# Patient Record
Sex: Female | Born: 1965 | Race: White | Hispanic: No | Marital: Married | Smoking: Never smoker
Health system: Southern US, Community
[De-identification: ages and names within clinical notes are randomized; demographics above are authoritative.]

## PROBLEM LIST (undated history)

## (undated) DIAGNOSIS — E785 Hyperlipidemia, unspecified: Secondary | ICD-10-CM

## (undated) DIAGNOSIS — K635 Polyp of colon: Secondary | ICD-10-CM

## (undated) DIAGNOSIS — T7840XA Allergy, unspecified, initial encounter: Secondary | ICD-10-CM

## (undated) DIAGNOSIS — B009 Herpesviral infection, unspecified: Secondary | ICD-10-CM

## (undated) HISTORY — DX: Polyp of colon: K63.5

## (undated) HISTORY — DX: Hyperlipidemia, unspecified: E78.5

## (undated) HISTORY — PX: CERVICAL FUSION: SHX112

## (undated) HISTORY — DX: Herpesviral infection, unspecified: B00.9

## (undated) HISTORY — DX: Allergy, unspecified, initial encounter: T78.40XA

---

## 2000-09-04 ENCOUNTER — Other Ambulatory Visit: Admission: RE | Admit: 2000-09-04 | Discharge: 2000-09-04 | Payer: Self-pay | Admitting: Family Medicine

## 2002-04-21 HISTORY — PX: CERVICAL FUSION: SHX112

## 2002-10-17 ENCOUNTER — Ambulatory Visit (HOSPITAL_COMMUNITY): Admission: RE | Admit: 2002-10-17 | Discharge: 2002-10-18 | Payer: Self-pay | Admitting: Neurosurgery

## 2003-02-16 ENCOUNTER — Ambulatory Visit (HOSPITAL_COMMUNITY): Admission: RE | Admit: 2003-02-16 | Discharge: 2003-02-16 | Payer: Self-pay | Admitting: Obstetrics and Gynecology

## 2003-03-10 ENCOUNTER — Ambulatory Visit (HOSPITAL_COMMUNITY): Admission: RE | Admit: 2003-03-10 | Discharge: 2003-03-10 | Payer: Self-pay | Admitting: Obstetrics and Gynecology

## 2003-06-22 ENCOUNTER — Other Ambulatory Visit: Admission: RE | Admit: 2003-06-22 | Discharge: 2003-06-22 | Payer: Self-pay | Admitting: Obstetrics and Gynecology

## 2003-08-02 ENCOUNTER — Inpatient Hospital Stay (HOSPITAL_COMMUNITY): Admission: AD | Admit: 2003-08-02 | Discharge: 2003-08-05 | Payer: Self-pay | Admitting: Obstetrics and Gynecology

## 2004-07-09 ENCOUNTER — Other Ambulatory Visit: Admission: RE | Admit: 2004-07-09 | Discharge: 2004-07-09 | Payer: Self-pay | Admitting: Obstetrics and Gynecology

## 2004-11-11 ENCOUNTER — Ambulatory Visit (HOSPITAL_COMMUNITY): Admission: RE | Admit: 2004-11-11 | Discharge: 2004-11-11 | Payer: Self-pay | Admitting: Obstetrics and Gynecology

## 2005-06-24 ENCOUNTER — Ambulatory Visit (HOSPITAL_COMMUNITY): Admission: RE | Admit: 2005-06-24 | Discharge: 2005-06-24 | Payer: Self-pay | Admitting: Obstetrics and Gynecology

## 2005-12-16 ENCOUNTER — Other Ambulatory Visit: Admission: RE | Admit: 2005-12-16 | Discharge: 2005-12-16 | Payer: Self-pay | Admitting: Obstetrics and Gynecology

## 2005-12-17 ENCOUNTER — Ambulatory Visit (HOSPITAL_COMMUNITY): Admission: RE | Admit: 2005-12-17 | Discharge: 2005-12-17 | Payer: Self-pay | Admitting: Obstetrics and Gynecology

## 2005-12-17 ENCOUNTER — Encounter (INDEPENDENT_AMBULATORY_CARE_PROVIDER_SITE_OTHER): Payer: Self-pay | Admitting: Specialist

## 2007-05-13 ENCOUNTER — Ambulatory Visit (HOSPITAL_COMMUNITY): Admission: RE | Admit: 2007-05-13 | Discharge: 2007-05-13 | Payer: Self-pay | Admitting: Obstetrics and Gynecology

## 2008-08-03 ENCOUNTER — Inpatient Hospital Stay (HOSPITAL_COMMUNITY): Admission: AD | Admit: 2008-08-03 | Discharge: 2008-08-05 | Payer: Self-pay | Admitting: Obstetrics and Gynecology

## 2009-10-18 ENCOUNTER — Ambulatory Visit (HOSPITAL_COMMUNITY): Admission: RE | Admit: 2009-10-18 | Discharge: 2009-10-18 | Payer: Self-pay | Admitting: Obstetrics and Gynecology

## 2010-07-31 LAB — CBC
HCT: 28.3 % — ABNORMAL LOW (ref 36.0–46.0)
HCT: 37.7 % (ref 36.0–46.0)
Hemoglobin: 9.8 g/dL — ABNORMAL LOW (ref 12.0–15.0)
Hemoglobin: 13 g/dL (ref 12.0–15.0)
MCHC: 34.5 g/dL (ref 30.0–36.0)
MCHC: 34.5 g/dL (ref 30.0–36.0)
MCV: 90.6 fL (ref 78.0–100.0)
MCV: 89.6 fL (ref 78.0–100.0)
Platelets: 140 10*3/uL — ABNORMAL LOW (ref 150–400)
Platelets: 170 10*3/uL (ref 150–400)
RBC: 3.12 MIL/uL — ABNORMAL LOW (ref 3.87–5.11)
RBC: 4.21 MIL/uL (ref 3.87–5.11)
RDW: 14.6 % (ref 11.5–15.5)
RDW: 14.7 % (ref 11.5–15.5)
WBC: 10.5 10*3/uL (ref 4.0–10.5)
WBC: 10.6 10*3/uL — ABNORMAL HIGH (ref 4.0–10.5)

## 2010-07-31 LAB — RPR: RPR Ser Ql: NONREACTIVE

## 2010-09-03 NOTE — Discharge Summary (Signed)
Whitney Logan, Whitney Logan                 ACCOUNT NO.:  [id number]   MEDICAL RECORD NO.:  [id number]          PATIENT TYPE:  INP   LOCATION:  9140                          FACILITY:  [facility]   PHYSICIAN:  Crist Fat. Rivard, M.D. DATE OF BIRTH:  08/07/1965   DATE OF ADMISSION:  08/03/2008  DATE OF DISCHARGE:  08/05/2008                               DISCHARGE SUMMARY   ADMISSION DIAGNOSES:  1. Intrauterine pregnancy at 38-4/7th weeks.  2. Spontaneous rupture of membranes.  3. History of previous cesarean section.   DISCHARGE DIAGNOSES:  1. Intrauterine pregnancy at 38-4/7th weeks, delivered.  2. Repeat low transverse cesarean section.   PROCEDURE:  Repeat low transverse cesarean section.   HOSPITAL COURSE:  [name] is a 45 year old gravida 4, para 1-0-2-1  admitted at 38-4/7th weeks with complaints of spontaneous rupture of  membranes early a.m. the day of admission as well as regular  contractions every 3-5 minutes since rupture of membranes.  The  patient's pregnancy remarkable for:  1. Advanced maternal age.  2. Previous C-section for failure to progress.  3. History of cervical vertebral fusion.  4. History of abnormal Pap.  5. IVF with donor eggs.  6. Spells with HSV.   Upon admission to [facility], the patient was noted to have grossly ruptured  membranes and in spontaneous labor.  Cervix was unable to be reached due  to posterior position.  The patient at that time expressed desire for  repeat low transverse cesarean section.  The risks, benefits, and  alternatives of cesarean section were discussed with the patient and she  desired to repeat.   The patient underwent a repeat low transverse cesarean section by Dr.  Pennie Rushing and delivered a viable female infant, Callie, weight 8 pounds 12  ounces with Apgars of 9 and 9 at one and five minutes respectively.  Surgery was uncomplicated.  Estimated blood loss 750 mL.  On admission,  the patient's hemoglobin 13.0.   On postoperative day  #1, the patient's hemoglobin 9.8.  However, the  patient was asymptomatic, without syncope or weakness.  The patient was  ambulating, voiding, and tolerating liquids and solids without  difficulty.  The patient's pain was well managed and breastfeeding was  going well.  The patient with normal lochia.   On postoperative day #2, the patient continued to do well and requested  discharge home.  The patient was passing flatus.  No BM.  The patient's  pain well controlled and breastfeeding without difficulty.  The patient  remained asymptomatic in regard to anemia.  The patient's incision noted  to have redness in the areas of previous adhesive contact, but no  evidence of cellulitis.  The patient remained with small lochia.  It was  felt that the patient had received benefit of her hospital stay and was  discharged to home.   DISCHARGE INSTRUCTIONS:  Per [facility] handout.   DISCHARGE MEDICATIONS:  1. Motrin 600 mg p.o. q.6 h. p.r.n. pain.  2. Tylox 1-2 tabs p.o. every 4-6 h. as needed for pain.  3. Maxaron Forte 1 cap p.o.  daily.   Discharge followup will occur at 6 weeks at [facility] and  the patient does not desire contraception due to the previous history of  infertility.      Rhona Leavens, CNM      Crist Fat Rivard, M.D.  Electronically Signed    NOS/MEDQ  D:  08/05/2008  T:  08/06/2008  Job:  [id number]

## 2010-09-03 NOTE — Op Note (Signed)
Whitney Logan, Whitney Logan                 ACCOUNT NO.:  [id number]   MEDICAL RECORD NO.:  [id number]          PATIENT TYPE:  INP   LOCATION:  9140                          [facility]   PHYSICIAN:  Hal Morales, M.D.DATE OF BIRTH:  [birth year]   DATE OF PROCEDURE:  08/03/2008  DATE OF DISCHARGE:                               OPERATIVE REPORT   PREOPERATIVE DIAGNOSES:  1. Intrauterine pregnancy at term.  2. Prior cesarean section.  3. Desire for repeat cesarean section.  4. History of infertility.  5. Premature rupture of membranes.   POSTOPERATIVE DIAGNOSES:  1. Intrauterine pregnancy at term.  2. Prior cesarean section.  3. Desire for repeat cesarean section.  4. History of infertility.  5. Premature rupture of membranes.   OPERATION:  Repeat low transverse cesarean section.   SURGEON:  Vanessa P. Pennie Rushing, MD   FIRST ASSISTANT:  Alonna Minium, certified nurse midwife.   ANESTHESIA:  Spinal.   ESTIMATED BLOOD LOSS:  750 mL.   COMPLICATIONS:  None.   FINDINGS:  The patient was delivered of a female infant whose name is  Tresa Endo, weighing 8 pounds 12 ounces with Apgars of 9 and 9 at 1 and 5  minutes respectively.  The uterus, tubes, and ovaries were normal for  the gravid state.  The placenta contained an eccentrically inserted 3-  vessel cord.   PROCEDURE:  The patient was taken to the operating room.  After  appropriate identification, was placed on the operating table.  After  the placement of adequate spinal anesthesia, she was placed in the  supine position with the left lateral tilt.  The abdomen and perineum  were prepped with multiple layers of Betadine and a Foley catheter  inserted into the bladder and connected to straight drainage.  The  abdomen was draped as a sterile field.  The suprapubic region was  infiltrated with a 10 mL solution of quarter percent Marcaine.  A  suprapubic incision was made at the site of the previous cesarean  section incision.   The abdomen was opened in layers.  The peritoneum was  entered and the bladder blade placed.  The uterus was incised  approximately 2 cm above the uterovesical fold and that incision taken  laterally on either side bluntly.  The membranes were ruptured and the  infant delivered from the occiput transverse position.  After the nares  and pharynx were suctioned and the cord clamped and cut, the infant was  handed off to the awaiting pediatricians.  The appropriate cord blood  was drawn and the placenta allowed to separate from the uterus and  removed from the operative field.  The uterine cavity was cleared of  membranes with laparotomy pads.  The uterine incision was closed with a  running interlocking suture of 0 Vicryl.  An imbricating suture of 0  Vicryl was then placed with adequate hemostasis.  Copious irrigation was  carried out.  The abdominal peritoneum was closed with running suture of  2-0 Vicryl.  The rectus muscles were reapproximated in the midline with  a figure-of-eight suture of 2-0  Vicryl.  The rectus muscles were  irrigated and made hemostatic with Bovie cautery.  The rectus fascia was  closed with a running suture of 0 Vicryl, then reinforced on either side  of midline with figure-of-eight sutures of 0 Vicryl.  The subcutaneous  tissue was irrigated and made hemostatic with Bovie cautery.  A  subcuticular suture of 3-0 Monocryl was used to close the skin incision.  A sterile dressing was applied and the patient taken from the operating  room to the recovery room in satisfactory condition having tolerated the  procedure well with sponge and instrument counts correct.  The infant  went to the full-term nursery.      Hal Morales, M.D.  Electronically Signed     VPH/MEDQ  D:  08/03/2008  T:  08/03/2008  Job:  [id number]

## 2010-09-03 NOTE — H&P (Signed)
NAMEJAKAYA, JACOBOWITZ NO.:  [id number]   MEDICAL RECORD NO.:  [id number]          PATIENT TYPE:  INP   LOCATION:  9140                          [facility]   PHYSICIAN:  Hal Morales, M.D.DATE OF BIRTH:  [birth year]   DATE OF ADMISSION:  08/03/2008  DATE OF DISCHARGE:                              HISTORY & PHYSICAL   HISTORY OF PRESENT ILLNESS:  Ms. Pasion is a 45 year old married white  female gravida 4, para 1-0-2-1 at 38-4/7 weeks per an [facility] of August 13, 2008, who presents with chief complaint of some leakage of fluid since  around 1:30 a.m. which awoke her from her sleep and since has had  regular contractions about every 3-5 minutes.  She reports good fetal  movement.  She had some pink tinged mucous as well.  She denies PIH or  UTI signs or symptoms, fever, cough, shortness of breath or GI  complaints.  She is followed by the MD service at [facility].  History  remarkable for:  1. Previous C-section for failure to progress and desires repeat.  2. Advanced maternal age.  3. History of C5-C6 cervical fusion.  4. History of abnormal Pap.  5. This pregnancy is a result of IVF with donor egg, six bouts with      history of HSV.   ALLERGIES:  The patient denies any latex or medication allergies.  She  does hay fever.   OBSTETRICAL HISTORY:  Rhett Bannister 1 was a primary low transverse cesarean  section in April 2005, a female named Thurmond Butts, weighed 713.  The patient did  reach 10 cm and pushed for three hours and still did not progress to  getting the baby delivered that way, so C-section was performed.  Gravida 2, spontaneous abortion, [redacted] weeks gestation January 2007.  Gravida 3 missed AB at 11 weeks in August 2007.  She did have a  subsequent D&C.  Current pregnancy is gravida 4.   PAST MEDICAL HISTORY:  1. Oral contraceptive pills in the past for birth control.  2. History of abnormal Pap 2002, repeats within normal limits.  3. This is IVF pregnancy with donor egg.   Reports retroverted uterus.  4. Husband is positive HSV.  The patient negative per report.  5. Patient positive HPV.  6. Reports varicella as a child.  7. History of anemia in the past.   SURGICAL HISTORY:  1. IVS is current pregnancy.  2. Wisdom teeth extraction.  3. C5-C6 cervical fusion in 2004.  4. Genetic history remarkable for the patient is age 24.  She does      have a cousin who has a Down syndrome baby.   FAMILY HISTORY:  Paternal grandmother heart disease.  The patient  questionable mitral valve prolapse.  Mom and dad chronic hypertension.  Paternal grandmother varicosities.  Paternal grandmother breast cancer.  Maternal grandfather COPD.  Maternal grandmother and paternal  grandmother as well as mom's father diabetic.   SOCIAL HISTORY:  She is a married white female.  Her husband's name is  Lawerance Bach.  The patient had her  Ph.D.  She is a full-time Chiropodist.  Father of the baby is a Education officer, community.  He is involved and  supportive.  Denies alcohol, tobacco or illicit drug use.   HISTORY OF PRESENT PREGNANCY:  She entered care October 2 with a  pregravid weight of 170, that day was 172-1/2, __________8.  Her BMI was  25.8.  The patient did have some flu-like symptoms and did receive  Tamiflu times 5 days October 1.  Normal Pap January 13.  She had first  trimester and a graded screen at [facility] and was within normal  limits.  She received H1N1 vaccine February 17, 2008.  She had her  anatomy ultrasound around 18-5/7 weeks showed normal growth and  development.  They are expecting a girl.  Cervical length was 4.4 cm  normal fluid, anterior placenta with a three-vessel cord.  The patient  did have HSV II glycoproteins drawn serum which was negative.  Treated  with Z-Pak around 21-4/7 weeks for sinusitis.  She did have an elevated  1-hour GTT however, her 3-hour GTT was within normal limits.  Prescribed  Protonix around [redacted] weeks gestation.  She did have of  repeat low  transverse cesarean section scheduled for April 21.  Was prescribed  Ambien as well as Valtrex prophylaxis around 34 weeks.   OBJECTIVE:  VITAL SIGNS: On admission blood pressure was 133/87, heart  rate 82, afebrile, respirations were 21.  Fetal heart rate 135 reactive,  no D cells moderate variability.  Toco uterine contractions every 2-3  minutes, mild to moderate on palpation.  GENERAL:  She is alert, oriented x3.  She does have labored breathing  with her contractions.  HEENT:  Grossly intact and within normal limits.  She does wear glasses.  CARDIOVASCULAR:  Regular rate and rhythm without murmur.  LUNGS:  Clear to auscultation bilaterally.  ABDOMEN:  Soft, nontender and gravid.  Sterile spec exam positive clear  pooling and grossly ruptured.  Cervix difficulty to assess secondary to  very posterior position.  EXTREMITIES:  She has 1+ pitting edema bilateral lower extremities.  However, her right leg is slightly greater than her left.  She does not  have any clonus and DTRs are 1+.   IMPRESSION:  1. Intrauterine pregnancy at 38-4/7 weeks.  2. Grossly ruptured with labor.  3. Previous C-section with desire for repeat.  4. Reactive fetal heart tracing.   PLAN:  1. Admit to was [facility] with Dr. Pennie Rushing as attending      physician.  2. Routine preoperative orders with Ancef 2 grams IV on call to OR.  3. Will prep for surgery.     Candice Prien, [place]      Hal Morales, M.D.  Electronically Signed   CHS/MEDQ  D:  08/03/2008  T:  08/03/2008  Job:  [id number]

## 2010-09-06 NOTE — Discharge Summary (Signed)
NAMEANGELLYNN, KIMBERLIN                 ACCOUNT NO.:  [id number]   MEDICAL RECORD NO.:  [id number]          PATIENT TYPE:  AMB   LOCATION:  SDC                           [facility]   PHYSICIAN:  Naima A. Dillard, M.D. DATE OF BIRTH:  1965-06-03   DATE OF ADMISSION:  12/17/2005  DATE OF DISCHARGE:                                 DISCHARGE SUMMARY   Ms. Elster is a 45 year old gravida 3, para 1-0-1-1, at 10-6/7 weeks by dates  but approximately 8 weeks by size. She presents for dilatation and  evacuation secondary to fetal demise diagnosed on ultrasound on December 16, 2005. She was seen for a routine OB visit. No fetal heat tones were able to  be auscultated. There was no pain or bleeding noted. She had an ultrasound  at that time that documented a 7-week, 6-day fetal demise. She had had a  positive intrauterine pregnancy with fetal heart tones on ultrasound at 7-  weeks' gestation. She was to have been between 10 and 11 weeks on yesterday  ultrasound. The patient at that time was offered options for management  including expected management, medical management versus surgical  management. The patient elected to proceed with a D&E today.   History has been remarkable for:  1. Conceptual Clomid.  2. Previous cesarean section.  3. Advanced maternal age.  4. History of C-spine fusion.  5. Spouse with HSV.  6. 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 Cystic fibrosis testing is negative. CBC will be  done in day surgery.   HISTORY OF PRESENT PREGNANCY:  Was as previously stated in the initial  dictation section.   OBSTETRICAL HISTORY:  In April 2005, she had a primary low transverse  Cesarean section for a female infant, weight 7 pounds 12 ounces at 40 weeks.  She was in labor 12 hours. She did get 10 cm but then had fetal distress and  failure to progress, and a cesarean was done. In January 2007, she had a six-  week spontaneous miscarriage.   MEDICAL HISTORY:  She is a previous  oral contraceptive user. In 2002, she  had an abnormal Pap but repeat was normal. She has a retroverted uterus. The  patient's husband does have a history of HSV, but patient is negative. 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 She reports usual  childhood illnesses. There was a question at one point about questionable  mitral valve prolapse for the patient but no official diagnosis has been  given. The patient has had a history of anemia.   SURGICAL HISTORY:  1. Includes wisdom teeth removed in the past.  2. Cervical fusion in 2004 at C5 and C6.   ALLERGIES:  None.   FAMILY HISTORY:  Paternal grandmother has heart disease. Her father has  chronic hypertension. Paternal grandmother had varicosities. Maternal  grandmother had breast cancer. Maternal grandfather had diabetes. Her  father, maternal grandmother and paternal grandmother have diabetes. Her  father does have a history of alcohol use.   GENETIC HISTORY:  Remarkable for patient's  age of 83. The patient's cousin  also has a Down syndrome baby.   SOCIAL HISTORY:  The patient is married to the father of the baby. He is  involved and supportive. His name is Marjory Meints. The patient is Caucasian,  of the [place] faith. 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 Her husband is a  Education officer, community. She was going to be followed by the certified nurse midwife service  with a plan for repeat cesarean section at term.   REVIEW OF SYSTEMS:  The patient denies any abdominal pain, vaginal bleeding,  nausea, vomiting, diarrhea, or any other clinical symptoms.   OBJECTIVE:  VITAL SIGNS:  Stable. The patient is afebrile.  HEENT:  Within normal limits.  LUNGS:  Breath sounds are clear.  HEART:  Regular rate and rhythm without murmur.  BREASTS:  Soft and nontender.  ABDOMEN:  Fundal height is approximately about 8-weeks' size and nontender.  Otherwise, the abdomen is soft and nontender.  PELVIC:  Cervix was closed and long and nontender  on exam yesterday by Wynelle Bourgeois.  EXTREMITIES:  Within normal limits.   ASSESSMENT:  1. Intrauterine fetal demise at 8 weeks.  2. Desires D&E.  3. B positive blood type.   PLAN:  1. Admit for outpatient D&E per Dr. Normand Sloop as attending physician.  2. Routine physician preoperative orders.  3. Support to patient for loss.      Renaldo Reel Emilee Hero, C.N.M.      Naima A. Normand Sloop, M.D.  Electronically Signed    VLL/MEDQ  D:  12/17/2005  T:  12/17/2005  Job:  [id number]

## 2010-09-06 NOTE — Op Note (Signed)
NAME:  Whitney Logan, Whitney Logan                           ACCOUNT NO.:  [id number]   MEDICAL RECORD NO.:  [id number]                   PATIENT TYPE:  INP   LOCATION:  9105                                 [facility]   PHYSICIAN:  Hal Morales, M.D.             DATE OF BIRTH:  07-May-1965   DATE OF PROCEDURE:  08/02/2003  DATE OF DISCHARGE:                                 OPERATIVE REPORT   PREOPERATIVE DIAGNOSES:  1. Intrauterine pregnancy at term.  2. Failure to descend.   POSTOPERATIVE DIAGNOSES:  1. Intrauterine pregnancy.  2. Failure to descend.   PROCEDURE:  Primary low transverse cesarean section.   SURGEON:  Hal Morales, M.D.   FIRST ASSISTANT:  Rica Koyanagi, C.N.M.   ANESTHESIA:  Epidural.   ESTIMATED BLOOD LOSS:  750 mL.   COMPLICATIONS:  None.   FINDINGS:  The patient was delivered of a female infant, whose name is Thurmond Butts,  weighing 7 pounds 12 ounces with Apgars of 9 and 9 at one and five minutes,  respectively.  The uterus, tubes and ovaries were normal for gravid state.   PREOPERATIVE DISCUSSION:  The patient had been pushing for approximately an  hour and had been able to move the fetal vertex from a +2 to +3 station.  With pushing, the patient did have fetal heart rate changes that were  variable in nature with a nadir to 70 beats per minute and then return to  baseline over the next 30 seconds or so.  At this point, the patient  expressed significant fatigue and a discussion was held with the patient  concerning options for delivery.  The options that were outlined included  vacuum assisted vaginal delivery with the attendant benefits of a possible  shortening of the second stage and the attendant risks of cephalohematoma,  maternal tissue damage, bleeding, inability to deliver by vacuum assisted  vaginal delivery, inability to deliver shoulders after delivery of the head  and cephalohematoma.  The second option was cesarean section with its  attendant benefit of rapid fetal delivery and its attendant risks of  anesthesia, bleeding, infection, damage to adjacent organs.  After careful  consideration and a discussion between the parents, the decision was made to  proceed with cesarean section.   DESCRIPTION OF PROCEDURE:  The patient was brought to the operating room  with her labor epidural in place after appropriate identification and placed  on the operating table.  Her labor epidural was dosed for surgical  anesthesia and the abdomen prepped with multiple layers of Betadine then  draped as a sterile field.  A Foley catheter was already in place and the  patient was in the supine position with a left lateral tilt.  After  assurance of adequate anesthesia, 15 mL of 0.25% Marcaine were infiltrated  subcutaneous at the suprapubic site of incision and a transverse incision  made.  The abdomen  was opened in layers and the peritoneum entered.  The  bladder blade was placed.  The uterus was incised approximately 2 cm above  the ureterovesical fold and extended laterally on either side bluntly.  The  infant was delivered from the ROA position and after having the nares and  pharynx suctioned and the cord clamped and cut, was handed to the awaiting  pediatricians.  The appropriate cord blood was drawn and the placenta noted  to have separated from the uterus and was removed from the operative field.  The uterine incision was closed with a running interlocking suture of 0  Vicryl.  An imbricating suture of 0 Vicryl was then placed.  Hemostasis was  adequate and copious irrigation carried out.  The abdominal peritoneum was  closed with a running suture of 2-0 Vicryl.  The rectus muscles were  reapproximated in the midline with a figure-of-eight suture of 2-0 Vicryl.  The rectus fascia was closed with a running suture of 0 Vicryl and  reinforced on either side of midline with figure-of-eight sutures of 0  Vicryl.  The subcutaneous tissue  was made hemostatic with Bovie and  irrigated.  Another 15 mL of 0.25% Marcaine was used to infiltrate the  incision and a subcuticular suture used to close the incision.  Steri-Strips  were applied to the incision and sterile dressing was applied.  The patient  was taken from the operating room to the recovery room in satisfactory  condition having tolerated the procedure well with sponge and instrument  counts correct.  The infant went to the full-term nursery.                                               Hal Morales, M.D.    VPH/MEDQ  D:  08/02/2003  T:  08/03/2003  Job:  [id number]

## 2010-09-06 NOTE — Op Note (Signed)
NAME:  Whitney Logan, Whitney Logan NO.:  [id number]   MEDICAL RECORD NO.:  [id number]                   PATIENT TYPE:  OIB   LOCATION:  3010                                 FACILITY:  [facility]   PHYSICIAN:  Cristi Loron, M.D.            DATE OF BIRTH:  [birth year]   DATE OF PROCEDURE:  10/17/2002  DATE OF DISCHARGE:                                 OPERATIVE REPORT   PREOPERATIVE DIAGNOSIS:  C5-6 herniated nucleus pulposus, stenosis, cervical  radiculopathy, cervicalgia.   POSTOPERATIVE DIAGNOSIS:  C5-6 herniated nucleus pulposus, stenosis,  cervical radiculopathy, cervicalgia.   PROCEDURE:  C5-6 extensive anterior cervical diskectomy/decompression;  interbody iliac crest allograft and arthrodesis, anterior cervical plating,  with Codman titanium plate and screws.   SURGEON:  Cristi Loron, M.D.   ASSISTANT:  Clydene Fake, M.D.   ANESTHESIA:  General endotracheal.   ESTIMATED BLOOD LOSS:  Minimal.   SPECIMENS:  None.   DRAINS:  None.   COMPLICATIONS:  None.   INDICATIONS:  The patient is a 45 year old white female who has suffered  from months of neck and right arm pain.  She failed medical management and  was worked up with a cervical MRI which demonstrated herniated disk at C5-6  on the right.  I discussed the various treatment options with the patient.  She weighed the risks, benefits and alternatives to surgery and now has  decided to proceed with at C5-6 anterior cervical; diskectomy and fusion.   DESCRIPTION OF PROCEDURE:  The patient was brought to the operating room by  the anesthesia team.  General endotracheal anesthesia was induced.  The  patient remained in the supine position.  A roll was placed under her  shoulders with the posterior neck in slight extension.  The anterior  cervical region was then prepared with Betadine scrub and Betadine solution.  Sterile drapes were applied.  I then injected the area to be incised with  Marcaine with epinephrine solution.  I used the scalpel to make a transverse  incision in the patient's left anterior neck.  I used the Metzenbaum  scissors to divide the platysmal muscle and then to dissect down medial to  the sternocleidomastoid muscle, the jugular vein and carotid artery.  I  bluntly dissected down toward the anterior cervical spine, thereby  identifying the esophagus and retracting it medially.   We cleared the soft tissue from the anterior cervical spine using Kittner  swabs.  I then inserted a bent spinal needle into the upper exposed  interspace.  We obtained intraoperative radiograph to confirm our location.   We then used the electrocautery to detach the longus colli muscle  bilaterally from this C5-6 intervertebral disk space.  We inserted the  Caspar self-retaining retractor for exposure and then incised the C5-6  intervertebral disk with the 15 blade scalpel.  I performed partial  diskectomy using the pituitary forceps and the Karlen curets.  We inserted  the distraction screws at C5 and C6, distracted the interspace.  I then used  the high speed drill to decorticate the vertebral end plates of Z6-1 and  drill away the remainder of C5-6 intervertebral disks as well as thin out  the posterior longitudinal ligament.  We incised the thinned out ligament  with the retinacular knife and then removed it with Kerrison punch  undercutting the vertebral end plates.  We then encountered a large  herniated disk on the right side which was compressing the right C6 nerve  root.  We removed the herniated disk and then performed generous  foraminotomies about the bilateral C6 nerve root decompressing the nerve  roots as well as the thecal sac.   We now turned attention to the arthrodesis.  We obtained bilateral iliac  crest allograft bone graft and fashioned it to each proximal dimensions, 7  mm in height and 1 cm in depth.  We inserted the bone graft into the  distracted  interspace and then removed the distraction screws.  There was a  good snug fitting bone graft in the interspace.  We now turned out attention  to the anterior spinal ossification.  We obtained the appropriate length,  Synthes anterior cervical plate.  We laid to along the anterior aspect of  the intervertebral bodies from C5 to C6, drilled two holes at C5, and two at  C6.  We tapped the holes and secured the plates to the intervertebral bodies  by placing two 14 mm screws at C5 and two at C6.  We then obtained an  intraoperative radiograph.  It demonstrated good position of his the plate  screws and MRI graft.  We then secured these screws to the plate using the  locking screw at each screw.  We then achieved stringent hemostasis using  bipolar electrocautery.  We copiously irrigated the wound out with  Bacitracin and removed the solution and then we removed the Caspar self-  retaining retractor, inspected the esophagus for any damage.  There was none  apparent.  We then reapproximated the patient's platysmal muscle with  interrupted 3-0 Vicryl suture, the subcutaneous with 3-0 Vicryl suture, and  the skin with Steri-Strips and Benzoin.  The wound was then coated with  Bacitracin ointment.  Sterile dressings were applied.  The drapes were  removed.  The patient was subsequently extubated by the anesthesia team and  transferred to the PACU in stable condition.  All sponge, needle and  instrument counts were correct at the end of the case.                                               Cristi Loron, M.D.    JDJ/MEDQ  D:  10/17/2002  T:  10/18/2002  Job:  [id number]

## 2010-09-06 NOTE — Discharge Summary (Signed)
NAME:  Whitney Logan, Whitney Logan                           ACCOUNT NO.:  [id number]   MEDICAL RECORD NO.:  [id number]                   PATIENT TYPE:  INP   LOCATION:  9105                                 FACILITY[facility]  [facility]   PHYSICIAN:  Osborn Coho, M.D.                DATE OF BIRTH:  1965/08/31   DATE OF ADMISSION:  08/02/2003  DATE OF DISCHARGE:                                 DISCHARGE SUMMARY   ADMISSION DIAGNOSES:  1. Intrauterine pregnancy at term.  2. Active labor.   DISCHARGE DIAGNOSES:  1. Intrauterine pregnancy at term.  2. Active labor.  3. Failure to descend.  4. Status post low transverse cesarean section.  5. Breast feeding.  6. Undecided regarding contraception.   PROCEDURES THIS ADMISSION:  Primary low transverse cesarean section for  delivery of a viable female infant named Thurmond Butts who weighed 7 pounds 12 ounces  and had Apgars of 9 and 9 on August 02, 2003 attended by Dr. Dierdre Forth  and Mack Guise, C.N.M.   HOSPITAL COURSE:  [name] is a 45 year old married black female gravida 1  para 0 at [redacted] weeks gestation who presented complaining of uterine  contractions since about 2 a.m. on August 02, 2003.  She also had onset of  leaking clear fluid and was admitted at 4 cm, 100%, vertex, -1 to 0 station.  She progressed rapidly to complete and started pushing at approximately  10:45 a.m. on April 13 and pushed well throughout the morning and early into  the afternoon.  By 1:15 p.m. she had made little progress and the discussion  was held with the physician discussing vacuum-assisted vaginal delivery or  cesarean section.  At 3:10 p.m. she was at a +2 station with caput in the  ROA position.  The options of vacuum-assisted vaginal delivery and its  attending risks as well as C-section and its attending risks or continuing  to push were all reviewed with the patient and she requested to continue  pushing.  She continued pushing until about 4 p.m. and had still not made  much change, and the baby's heart rate started having variable  decelerations.  At this point Dr. Pennie Rushing was again called to discuss  operative delivery and the recommendations were the same, and the patient  elected to choose a cesarean section for delivery.  She underwent the same,  a primary low transverse cesarean section for the delivery of a viable female  infant named Thurmond Butts who weighed 7 pounds 12 ounces and had Apgars of 9 and 9  on August 02, 2003 attended by Dr. Dierdre Forth and Mack Guise,  C.N.M.  Please see operative note and dictation for further details.  Postoperatively the patient has done well.  She is ambulating, voiding, and  eating without difficulty.  Her vital signs are stable and she has been  afebrile throughout her postoperative course.  Her pain has  been well  managed with p.o. medications.  She is breastfeeding also without difficulty  at this time.  She is currently undecided regarding contraception.  She is  deemed ready for discharge today.   DISCHARGE INSTRUCTIONS:  As per the [facility] handout.   DISCHARGE MEDICATIONS:  1. Motrin 600 mg p.o. q.6h. p.r.n. for pain.  2. Tylox one to two p.o. q.4-6h. p.r.n. for pain.  3. Prenatal vitamins daily.   DISCHARGE LABORATORY DATA:  Her hemoglobin is 11.7, her wbc count is 14.8,  and her platelets are 169.   DISCHARGE FOLLOW-UP:  Will be in 4-6 weeks at [facility] or  p.r.n.   DISCHARGE CONDITION:  Well and stable.     Concha Pyo. Duplantis, C.N.M.              Osborn Coho, M.D.    SJD/MEDQ  D:  08/05/2003  T:  08/05/2003  Job:  [id number]

## 2010-09-06 NOTE — H&P (Signed)
NAME:  Whitney Logan, SARDO                           ACCOUNT NO.:  [id number]   MEDICAL RECORD NO.:  [id number]                   PATIENT TYPE:  INP   LOCATION:  9163                                 FACILITY:  [facility]   PHYSICIAN:  Osborn Coho, M.D.                DATE OF BIRTH:  1965-11-28   DATE OF ADMISSION:  08/02/2003  DATE OF DISCHARGE:                                HISTORY & PHYSICAL   Ms. Sutliff is a 45 year old gravida 1, para 0, at 40 weeks, who presents with  uterine contractions since 2 a.m., now leaking clear fluid, no bleeding, and  positive fetal movement is noted.  She denies any headache, visual symptoms,  or epigastric pain.  The pregnancy has been remarkable for:   1. Advanced maternal age with normal amniocentesis.  2. History of abnormal Pap  x2.  3. Positive HPV.  4. Spouse with positive HSV.  Patient without lesions or prodrome.  5. History of back surgery and cervical fusion.    PRENATAL LABORATORY DATA:  Blood type is B positive, Rh antibody negative.  VDRL nonreactive.  Rubella titer positive.  Hepatitis B surface antigen  negative.  Cystic fibrosis testing negative.  GC and Chlamydia cultures were  negative.  Pap was normal.  Glucose challenge was normal.  Hemoglobin upon  entering the practice was 12.9, it was 12.6 at 27 weeks.  Group B strep  culture was negative at 36 weeks.  Cultures were negative at 36 weeks.  EDC  of August 02, 2003, was established by last menstrual period and was in  agreement with ultrasound at approximately 18 weeks.   HISTORY OF PRESENT PREGNANCY:  The patient entered care at approximately 10  weeks.  She had a TSH done for a remote history of thyroiditis.  This was  normal.  She elected an amniocentesis.  This was done by Dr. Estanislado Pandy, with a  normal female finding.  She had another ultrasound at 18 weeks that showed  normal growth and development.  She continued to have some pubic pain and  back pain.  She was offered physical therapy,  but she declined.  Her Glucola  was normal.  She had a fetal fibronectin and cultures done at 29 weeks  secondary to some cramping.  All that was negative.  The rest of her  pregnancy was essentially uncomplicated.   PAST OBSTETRICAL HISTORY:  She is a primigravida.   PAST MEDICAL HISTORY:  She was on Ortho Tri-Cyclen until February 2004.  In  2002 she had an abnormal Pap and had a normal repeat.  Her husband has a  history of HSV positive situation.  The patient has never had any issues.  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  History of yeast infections and reports the  usual childhood illnesses.  She had a questionable history of mitral valve  prolapse in the past, but this resolved and  no follow-up was necessary.  She  has a history of anemia.  She had a history of thyroiditis in the past, but  that did resolve and has not required any medication.   PAST SURGICAL HISTORY:  Wisdom teeth removed in 1988, and in June 2004 she  had cervical fusion done at C5 and C6 due to a hernia.   FAMILY HISTORY:  Maternal grandmother had congestive heart failure.  Father  has hypertension.  Paternal grandmother has varicose veins.  Maternal  grandfather has emphysema.  Father, maternal grandmother, and paternal  grandmother have diabetes.  Maternal grandmother had breast cancer.  Father's side of the family had alcohol problems.   GENETIC HISTORY:  The patient is 42 and had an amniocentesis.  The patient's  first cousin had a Down syndrome baby, and the patient's first cousin has  had a child born with severe birth defects.   ALLERGIES:  None.   SOCIAL HISTORY:  The patient is married to the father of the baby.  He is  involved and supportive.  His name is Vinisha Faxon.  The patient is a  professor.  Her husband is a Education officer, community.  She has been followed by the  certified nurse midwife service of [place].  She denies any  alcohol, drug, or tobacco use during this pregnancy.   PHYSICAL  EXAMINATION:  VITAL SIGNS:  Stable.  The patient is afebrile.  HEENT:  Within normal limits.  CHEST:  Bilateral breath sounds are clear.  CARDIAC:  Regular rate and rhythm without murmur.  BREASTS:  Soft and nontender.  ABDOMEN:  Fundal height is approximately 38 cm.  Estimated fetal weight is 7-  1/2 to 8 pounds.  Uterine contractions are every two minutes, moderate to  strong quality.  Fetal heart rate is reactive with no decelerations.  PELVIC:  Cervical exam 4 cm, 100%, vertex at a -1 to a 0 station.  Spontaneous rupture of membranes occurred with exam with clear fluid noted.  EXTREMITIES:  Deep tendon reflexes are 2+ without clonus.  There is a trace  edema noted.   IMPRESSION:  1. Intrauterine pregnancy at 40 weeks.  2. Active labor.   PLAN:  1. Admit to birthing suite per consult with Dr. Su Hilt as attending     physician.  2. Routine certified nurse midwife orders.  3. Plan epidural as soon as possible.     Renaldo Reel Emilee Hero, C.N.M.                   Osborn Coho, M.D.    VLL/MEDQ  D:  08/02/2003  T:  08/02/2003  Job:  [id number]

## 2010-09-06 NOTE — Op Note (Signed)
   NAME:  Whitney Logan, Whitney Logan NO.:  [id number]   MEDICAL RECORD NO.:  [id number]                   PATIENT TYPE:  OUT   LOCATION:  ULT                                  [facility]ILITY[facility]   PHYSICIAN:  Crist Fat. Rivard, M.D.              DATE OF BIRTH:  02-Oct-1965   DATE OF PROCEDURE:  02/16/2003  DATE OF DISCHARGE:                                 OPERATIVE REPORT   PREOPERATIVE DIAGNOSES:  1. Intrauterine pregnancy at 16 weeks and one day.  2. Advanced maternal age.   POSTOPERATIVE DIAGNOSES:  1. Intrauterine pregnancy at 16 weeks and one day.  2. Advanced maternal age.   ANESTHESIA:  Local.   PROCEDURE:  Genetic amniocentesis.   SURGEON:  Crist Fat. Rivard, M.D.   ESTIMATED BLOOD LOSS:  Minimal.   DESCRIPTION OF PROCEDURE:  After being informed of the planned procedure  with possible complications including bleeding, spontaneous rupture of  membranes, miscarriage rate of 1/200, informed consent was obtained.  The  patient is placed in the dorsal decubitus position, pelvis tilted to the  left, and a preliminary ultrasound reveals a single intrauterine pregnancy  at 16 weeks and one day with a fundal placenta, normal amniotic fluid, and a  fetal heart rate of 145 beats per minute.  Skin is prepped with Betadine,  local anesthesia is performed with lidocaine 1%.  Initial puncture does not  reach the uterus.  Needle is retrieved and the second puncture site is  chosen, disinfected and anesthetized with lidocaine 1% using a 22-gauge  Ultraview needle, we performed a single puncture amniocentesis, retrieving  20 mL of clear amniotic fluid.  Post procedure ultrasound reveals no  bleeding site, normal amniotic fluid appearance and fetal heart rate of 147  beats per minute.   The procedure was well tolerated by the patient who was sent home on  restricted activity for 24 hours and avoidance of strenuous physical  activity for seven days.                                 Crist Fat Rivard, M.D.    SAR/MEDQ  D:  02/16/2003  T:  02/16/2003  Job:  [id number]

## 2010-09-06 NOTE — Op Note (Signed)
Whitney Logan, Whitney Logan                 ACCOUNT NO.:  [id number]   MEDICAL RECORD NO.:  [id number]          PATIENT TYPE:  AMB   LOCATION:  SDC                           [facility]   PHYSICIAN:  Naima A. Dillard, M.D. DATE OF BIRTH:  05/16/1965   DATE OF PROCEDURE:  12/17/2005  DATE OF DISCHARGE:                                 OPERATIVE REPORT   PREOPERATIVE DIAGNOSIS:  Missed abortion in the first trimester.   POSTOPERATIVE DIAGNOSIS:  Missed abortion in the first trimester.   PROCEDURE:  D&E.   SURGEON:  Naima A. Dillard, M.D.   ASSISTANT:  None.   ANESTHESIA:  MAC and local.   SPECIMENS:  Products of conception sent to Pathology.   ESTIMATED BLOOD LOSS:  Minimal.   COMPLICATIONS:  None.  The patient went to PACU in stable condition.   PROCEDURE IN DETAIL:  The patient was taken to the operating room, where she  was given MAC anesthesia, placed in dorsal lithotomy position and prepped  and draped in a normal fashion.  A straight catheter was placed into the  bladder and a minimum amount of urine was obtained.  The patient was  examined and noted to have about a seven week retroverted uterus with no  adnexal masses.   A bivalve speculum was placed into the vagina.  The anterior lip of the  cervix was grasped with a single-toothed tenaculum.  The cervix was dilated  up to a 21 with [facility] dilators.  A size 7 curette was placed into the  uterine cavity and a suction curettage was done until a gritty texture was  noted.  A sharp curette was placed into the uterine cavity and sharp  curettage was done until a gritty texture was noted.  The suction curette  was placed back into the uterine cavity and the only thing that was seen was  blood.   All instruments were removed from the uterus.  The tenaculum site had some  bleeding at the underside of the cervix, which was made hemostatic with  silver nitrate.  The sponge, lap and needle counts were correct.  After the  tenaculum  was placed but before dilation of the cervix, 10 mL of 1%  lidocaine was used for a cervical block.  Before the procedure the patient was consented and told the risks not  limited to bleeding, infection, damage to internal organs, scarring of the  uterus, __________ Asherman's syndrome, perforation of the uterus and need  for D&C.  Once again, the patient understood and wanted to proceed.      Naima A. Normand Sloop, M.D.  Electronically Signed     NAD/MEDQ  D:  12/17/2005  T:  12/18/2005  Job:  [id number]

## 2011-04-30 ENCOUNTER — Other Ambulatory Visit (HOSPITAL_COMMUNITY): Payer: Self-pay | Admitting: Obstetrics and Gynecology

## 2011-04-30 DIAGNOSIS — Z1231 Encounter for screening mammogram for malignant neoplasm of breast: Secondary | ICD-10-CM

## 2011-05-21 ENCOUNTER — Ambulatory Visit (HOSPITAL_COMMUNITY)
Admission: RE | Admit: 2011-05-21 | Discharge: 2011-05-21 | Disposition: A | Discharge status: Home / Self Care | Payer: BC Managed Care – PPO | Source: Ambulatory Visit | Attending: Obstetrics and Gynecology | Admitting: Obstetrics and Gynecology

## 2011-05-21 DIAGNOSIS — Z1231 Encounter for screening mammogram for malignant neoplasm of breast: Secondary | ICD-10-CM | POA: Insufficient documentation

## 2011-09-19 ENCOUNTER — Other Ambulatory Visit: Payer: Self-pay

## 2011-09-19 MED ORDER — NORETHIN ACE-ETH ESTRAD-FE 1-20 MG-MCG PO TABS: 1.0000 | ORAL_TABLET | Freq: Every day | ORAL | Status: DC

## 2011-09-19 NOTE — Telephone Encounter (Signed)
Fax refill ok x 1 with 1 rf  ld

## 2011-09-24 ENCOUNTER — Telehealth: Payer: Self-pay

## 2011-09-24 MED ORDER — NORETHIN ACE-ETH ESTRAD-FE 1-20 MG-MCG PO TABS: 1.0000 | ORAL_TABLET | Freq: Every day | ORAL | Status: DC

## 2011-09-24 NOTE — Telephone Encounter (Signed)
Rx Junel on file e-pres to pharm on file. Aex sched 11-05-11 with sr.

## 2011-11-05 ENCOUNTER — Encounter: Payer: Self-pay | Admitting: Obstetrics and Gynecology

## 2011-11-05 ENCOUNTER — Ambulatory Visit (INDEPENDENT_AMBULATORY_CARE_PROVIDER_SITE_OTHER): Payer: BC Managed Care – PPO | Admitting: Obstetrics and Gynecology

## 2011-11-05 VITALS — BP 110/68 | Ht 68.0 in | Wt 178.0 lb

## 2011-11-05 DIAGNOSIS — Z124 Encounter for screening for malignant neoplasm of cervix: Secondary | ICD-10-CM

## 2011-11-05 DIAGNOSIS — Z01419 Encounter for gynecological examination (general) (routine) without abnormal findings: Secondary | ICD-10-CM

## 2011-11-05 MED ORDER — NORETHIN ACE-ETH ESTRAD-FE 1-20 MG-MCG PO TABS: 1.0000 | ORAL_TABLET | Freq: Every day | ORAL | Status: DC

## 2011-11-05 MED ORDER — VALACYCLOVIR HCL 500 MG PO TABS: 500.0000 mg | ORAL_TABLET | Freq: Two times a day (BID) | ORAL | Status: DC

## 2011-11-05 NOTE — Progress Notes (Signed)
The patient reports:normal menses, no abnormal bleeding, pelvic pain or discharge  Contraception:oral contraceptives (estrogen/progesterone)Junel  Last mammogram: was normal May 2013 Last pap: was normal June  2012  GC/Chlamydia cultures offered: declined HIV/RPR/HbsAg offered:  declined HSV 1 and 2 glycoprotein offered: declined  Menstrual cycle regular and monthly: Yes Menstrual flow normal: Yes  Urinary symptoms: none Normal bowel movements: Yes Reports abuse at home: No: Leaking of urine with sneezing and coughing .  Subjective:    Whitney Logan is a 46 y.o. female, G4P2, who presents for an annual exam.     History   Social History  . Marital Status: Married    Spouse Name: N/A    Number of Children: N/A  . Years of Education: N/A   Social History Main Topics  . Smoking status: Never Smoker   . Smokeless tobacco: Never Used  . Alcohol Use: No  . Drug Use: No  . Sexually Active: Yes    Birth Control/ Protection: Pill   Other Topics Concern  . None   Social History Narrative  . None    Menstrual cycle:   LMP: Patient's last menstrual period was 10/13/2011.           Cycle: normal  The following portions of the patient's history were reviewed and updated as appropriate: allergies, current medications, past family history, past medical history, past social history, past surgical history and problem list.  Review of Systems Pertinent items are noted in HPI. Breast:Negative for breast lump,nipple discharge or nipple retraction Gastrointestinal: Negative for abdominal pain, change in bowel habits or rectal bleeding Urinary:negative   Objective:    BP 110/68  Ht 5\' 8"  (1.727 m)  Wt 178 lb (80.74 kg)  BMI 27.06 kg/m2  LMP 10/13/2011    Weight:  Wt Readings from Last 1 Encounters:  11/05/11 178 lb (80.74 kg)          BMI: Body mass index is 27.06 kg/(m^2).  General Appearance: Alert, appropriate appearance for age. No acute distress HEENT: Grossly  normal Neck / Thyroid: Supple, no masses, nodes or enlargement Lungs: clear to auscultation bilaterally Back: No CVA tenderness Breast Exam: No masses or nodes.No dimpling, nipple retraction or discharge. Cardiovascular: Regular rate and rhythm. S1, S2, no murmur Gastrointestinal: Soft, non-tender, no masses or organomegaly Pelvic Exam: Vulva and vagina appear normal. Bimanual exam reveals normal uterus and adnexa. Rectovaginal: normal rectal, no masses Lymphatic Exam: Non-palpable nodes in neck, clavicular, axillary, or inguinal regions Skin: no rash or abnormalities Neurologic: Normal gait and speech, no tremor  Psychiatric: Alert and oriented, appropriate affect.    Assessment:    Normal gyn exam    Plan:    mammogram pap smear return annually or prn STD screening: declined Contraception:oral contraceptives (estrogen/progesterone) Jeananne Rama AMD

## 2011-11-06 LAB — PAP IG W/ RFLX HPV ASCU

## 2011-11-30 ENCOUNTER — Other Ambulatory Visit: Payer: Self-pay | Admitting: Obstetrics and Gynecology

## 2011-12-01 MED ORDER — NORETHIN ACE-ETH ESTRAD-FE 1-20 MG-MCG PO TABS: ORAL_TABLET | ORAL | Status: DC

## 2011-12-01 NOTE — Telephone Encounter (Signed)
Lm on vm for pt to cb to sched AEX. Rx for Junel 1/20 e-pres to pharm on file.

## 2012-03-01 ENCOUNTER — Other Ambulatory Visit: Payer: Self-pay | Admitting: Obstetrics and Gynecology

## 2012-03-17 ENCOUNTER — Telehealth: Payer: Self-pay | Admitting: Obstetrics and Gynecology

## 2012-03-17 NOTE — Telephone Encounter (Signed)
VM from pt. States Rx label for Junel states only 1 RF. Was seen 10/2011. Will contact pharmacy. Does not desire phone call from office.

## 2012-06-26 ENCOUNTER — Other Ambulatory Visit: Payer: Self-pay | Admitting: Obstetrics and Gynecology

## 2013-12-14 ENCOUNTER — Other Ambulatory Visit (HOSPITAL_COMMUNITY): Payer: Self-pay | Admitting: Obstetrics and Gynecology

## 2013-12-14 DIAGNOSIS — Z1231 Encounter for screening mammogram for malignant neoplasm of breast: Secondary | ICD-10-CM

## 2013-12-21 ENCOUNTER — Ambulatory Visit (HOSPITAL_COMMUNITY)
Admission: RE | Admit: 2013-12-21 | Discharge: 2013-12-21 | Disposition: A | Discharge status: Home / Self Care | Payer: BC Managed Care – PPO | Source: Ambulatory Visit | Attending: Obstetrics and Gynecology | Admitting: Obstetrics and Gynecology

## 2013-12-21 DIAGNOSIS — Z1231 Encounter for screening mammogram for malignant neoplasm of breast: Secondary | ICD-10-CM | POA: Insufficient documentation

## 2014-02-20 ENCOUNTER — Encounter: Payer: Self-pay | Admitting: Obstetrics and Gynecology

## 2014-11-02 ENCOUNTER — Other Ambulatory Visit: Payer: Self-pay | Admitting: Family Medicine

## 2014-11-02 ENCOUNTER — Ambulatory Visit
Admission: RE | Admit: 2014-11-02 | Discharge: 2014-11-02 | Disposition: A | Discharge status: Home / Self Care | Payer: BC Managed Care – PPO | Source: Ambulatory Visit | Attending: Family Medicine | Admitting: Family Medicine

## 2014-11-02 DIAGNOSIS — M5432 Sciatica, left side: Secondary | ICD-10-CM

## 2014-12-14 ENCOUNTER — Other Ambulatory Visit: Payer: Self-pay | Admitting: Family Medicine

## 2014-12-14 DIAGNOSIS — M79604 Pain in right leg: Secondary | ICD-10-CM

## 2014-12-14 DIAGNOSIS — M545 Low back pain: Secondary | ICD-10-CM

## 2014-12-16 ENCOUNTER — Encounter (HOSPITAL_COMMUNITY): Payer: Self-pay | Admitting: Emergency Medicine

## 2014-12-16 ENCOUNTER — Other Ambulatory Visit: Payer: BC Managed Care – PPO

## 2014-12-16 ENCOUNTER — Emergency Department (HOSPITAL_COMMUNITY)
Admission: EM | Admit: 2014-12-16 | Discharge: 2014-12-16 | Disposition: A | Discharge status: Home / Self Care | Payer: BC Managed Care – PPO | Attending: Emergency Medicine | Admitting: Emergency Medicine

## 2014-12-16 ENCOUNTER — Emergency Department (HOSPITAL_COMMUNITY): Payer: BC Managed Care – PPO

## 2014-12-16 DIAGNOSIS — Z791 Long term (current) use of non-steroidal anti-inflammatories (NSAID): Secondary | ICD-10-CM | POA: Insufficient documentation

## 2014-12-16 DIAGNOSIS — M25551 Pain in right hip: Secondary | ICD-10-CM | POA: Diagnosis present

## 2014-12-16 DIAGNOSIS — M5410 Radiculopathy, site unspecified: Secondary | ICD-10-CM

## 2014-12-16 DIAGNOSIS — M549 Dorsalgia, unspecified: Secondary | ICD-10-CM

## 2014-12-16 DIAGNOSIS — M5126 Other intervertebral disc displacement, lumbar region: Secondary | ICD-10-CM

## 2014-12-16 DIAGNOSIS — M5431 Sciatica, right side: Secondary | ICD-10-CM | POA: Diagnosis not present

## 2014-12-16 DIAGNOSIS — Z79899 Other long term (current) drug therapy: Secondary | ICD-10-CM | POA: Insufficient documentation

## 2014-12-16 DIAGNOSIS — M5106 Intervertebral disc disorders with myelopathy, lumbar region: Secondary | ICD-10-CM | POA: Diagnosis not present

## 2014-12-16 MED ORDER — METHYLPREDNISOLONE 4 MG PO TBPK: ORAL_TABLET | ORAL | Status: DC

## 2014-12-16 MED ORDER — LORAZEPAM 2 MG/ML IJ SOLN
1.0000 mg | Freq: Once | INTRAMUSCULAR | Status: AC | PRN
  Administered 2014-12-16: 1 mg via INTRAVENOUS
  Filled 2014-12-16: qty 1

## 2014-12-16 MED ORDER — OXYCODONE HCL 5 MG PO TABS: 5.0000 mg | ORAL_TABLET | ORAL | Status: DC | PRN

## 2014-12-16 MED ORDER — ONDANSETRON HCL 4 MG/2ML IJ SOLN
4.0000 mg | Freq: Once | INTRAMUSCULAR | Status: AC | PRN
  Administered 2014-12-16: 4 mg via INTRAVENOUS
  Filled 2014-12-16: qty 2

## 2014-12-16 MED ORDER — HYDROMORPHONE HCL 1 MG/ML IJ SOLN
1.0000 mg | INTRAMUSCULAR | Status: AC | PRN
  Administered 2014-12-16 (×3): 1 mg via INTRAVENOUS
  Filled 2014-12-16 (×3): qty 1

## 2014-12-16 NOTE — ED Notes (Signed)
MD aware patient is now in pain at rest and is crying. No new orders received. Advised to continue with remaining Dilaudid doses. Will continue to monitor.

## 2014-12-16 NOTE — ED Provider Notes (Signed)
CSN: [id number]     Arrival date & time 12/16/14  4098 History   First MD Initiated Contact with Patient 12/16/14 [phone]     Chief Complaint  Patient presents with  . Hip Pain  . Leg Pain    Patient is a 49 y.o. female presenting with back pain. The history is provided by the patient.  Back Pain Pain location: right lower back and hip region. Quality:  Aching and shooting Radiates to:  R foot Pain severity:  Severe Pain is:  Unable to specify Onset quality:  Gradual Duration: several weeks. Timing:  Constant Progression:  Waxing and waning Context: not falling, not occupational injury and not recent injury   Relieved by:  Nothing Worsened by:  Ambulation Ineffective treatments: SHe is on neurontin and T3. Associated symptoms: numbness   Associated symptoms: no abdominal pain, no fever and no weakness    patient saw her primary care doctor. She has been treated with steroids  and Neurontin as well as Tylenol 3. she is actually scheduled for an MRI of her lumbar spine this afternoon. The pain was  much more severe this morning. She has trouble even standing up to go to the bathroom.  Past Medical History  Diagnosis Date  . Allergy     hayfever    Past Surgical History  Procedure Laterality Date  . Cesarean section  2005/2010   Family History  Problem Relation Age of Onset  . Heart attack Father     12/2010  . COPD Maternal Grandfather   . Heart failure Paternal Grandmother    Social History  Substance Use Topics  . Smoking status: Never Smoker   . Smokeless tobacco: Never Used  . Alcohol Use: No   OB History    Gravida Para Term Preterm AB TAB SAB Ectopic Multiple Living   4 2             Review of Systems  Constitutional: Negative for fever.  Gastrointestinal: Negative for abdominal pain.  Musculoskeletal: Positive for back pain.  Neurological: Positive for numbness. Negative for weakness.  All other systems reviewed and are negative.     Allergies   Seasonal ic  Home Medications   Prior to Admission medications   Medication Sig Start Date End Date Taking? Authorizing Provider  acyclovir (ZOVIRAX) 800 MG tablet Take 800 mg by mouth daily as needed (out breaks).    Yes Historical Provider, MD  diphenhydrAMINE (BENADRYL) 25 MG tablet Take 25 mg by mouth daily.   Yes Historical Provider, MD  fexofenadine (ALLEGRA) 180 MG tablet Take 180 mg by mouth daily.   Yes Historical Provider, MD  gabapentin (NEURONTIN) 100 MG capsule Take 300 mg by mouth 3 (three) times daily.   Yes Historical Provider, MD  JUNEL FE 1/20 1-20 MG-MCG tablet PT TO TAKE 1 TABLET DAILY FOR 12 CONSECUTIVE WEEKS OF ACTIVE PILLS 03/01/12  Yes Silverio Lay, MD  metaxalone (SKELAXIN) 800 MG tablet TAKE 1 TABLET 3 TIMES A DAY FOR 10 DAYS 11/14/14  Yes Historical Provider, MD  naproxen sodium (ANAPROX) 220 MG tablet Take 440 mg by mouth 2 (two) times daily with a meal.   Yes Historical Provider, MD  methylPREDNISolone (MEDROL DOSEPAK) 4 MG TBPK tablet Take as directed 12/16/14   Linwood Dibbles, MD  norethindrone-ethinyl estradiol (JUNEL FE 1/20) 1-20 MG-MCG tablet Take 1 tablet by mouth daily. Patient not taking: Reported on 12/16/2014 11/05/11 12/16/14  Silverio Lay, MD  oxyCODONE (ROXICODONE) 5 MG immediate release tablet  Take 1-2 tablets (5-10 mg total) by mouth every 4 (four) hours as needed for severe pain. 12/16/14   Linwood Dibbles, MD  valACYclovir (VALTREX) 500 MG tablet Take 1 tablet (500 mg total) by mouth 2 (two) times daily. Patient not taking: Reported on 12/16/2014 11/05/11   Silverio Lay, MD   BP 110/72 mmHg  Pulse 79  Temp(Src) 98 F (36.7 C) (Oral)  Resp 16  Ht 5' 8.5" (1.74 m)  Wt 185 lb (83.915 kg)  BMI 27.72 kg/m2  SpO2 93% Physical Exam  HENT:  Head: Normocephalic and atraumatic.  Right Ear: External ear normal.  Left Ear: External ear normal.  Nose: Nose normal.  Eyes: Conjunctivae and EOM are normal.  Neck: Neck supple. No tracheal deviation present.   Pulmonary/Chest: Effort normal. No stridor. No respiratory distress.  Musculoskeletal: She exhibits no edema or tenderness.       Lumbar back: She exhibits decreased range of motion, pain and spasm. She exhibits no swelling and no edema.  Neurological: She is alert. She is not disoriented. No cranial nerve deficit or sensory deficit. She exhibits normal muscle tone. Coordination normal.  Reflex Scores:      Patellar reflexes are 2+ on the right side and 2+ on the left side.      Achilles reflexes are 2+ on the right side and 2+ on the left side. Skin: Skin is warm and dry. No rash noted. She is not diaphoretic. No erythema.  Psychiatric: She has a normal mood and affect. Her behavior is normal. Thought content normal.  Nursing note and vitals reviewed.   ED Course  Procedures (including critical care time) Labs Review Labs Reviewed - No data to display  Imaging Review Mr Lumbar Spine Wo Contrast  12/16/2014   CLINICAL DATA:  Back pain with radiculopathy  EXAM: MRI LUMBAR SPINE WITHOUT CONTRAST  TECHNIQUE: Multiplanar, multisequence MR imaging of the lumbar spine was performed. No intravenous contrast was administered.  COMPARISON:  Lumbar radiographs 11/02/2014  FINDINGS: Image quality degraded by motion.  Normal alignment. Negative for fracture or mass. Conus medullaris is normal and terminates at L1-2.  L1-2:  Mild disc degeneration without stenosis  L2-3:  Negative  L3-4: Mild disc bulging and mild facet degeneration without significant spinal stenosis  L4-5: Moderately large central disc protrusion. Bilateral facet hypertrophy. Moderate spinal stenosis. Subarticular stenosis is present causing impingement of the L5 nerve root bilaterally.  L5-S1:  Small central disc protrusion without neural impingement  IMPRESSION: Moderate spinal stenosis at L4-5. Moderately large central disc protrusion compressing the thecal sac as well as the L5 nerve roots bilaterally.   Electronically Signed   By:  Marlan Palau M.D.   On: 12/16/2014 13:00   I have personally reviewed and evaluated these images and lab results as part of my medical decision-making.  Medications  HYDROmorphone (DILAUDID) injection 1 mg (1 mg Intravenous Given 12/16/14 0920)  ondansetron (ZOFRAN) injection 4 mg (4 mg Intravenous Given 12/16/14 0807)  LORazepam (ATIVAN) injection 1 mg (1 mg Intravenous Given 12/16/14 1153)     MDM   Final diagnoses:  Sciatica, right  Herniated lumbar intervertebral disc   Patient continued to have recurrent episodes of severe pain in the emergency department when lying down.  Symptoms are suggestive of a lumbar radiculopathy. We'll attempt to get an MRI of her lumbar spine today.  I reviewed the imaging tests with the patient, her husband as well as Dr. Lovell Sheehan .  he will see the patient in  the office on 4:45 PM on Tuesday as long as the patient is feeling better. Patient's pain did improve with treatment in the emergency room. I explained to the patient and husband I  will discharge her home on stronger oral pain medications. Hopefully this will help alleviate her symptoms. If her symptoms return in or she is unable to tolerate the pain  she should contact Dr. Lovell Sheehan and proceed to the [facility] emergency room.     Linwood Dibbles, MD 12/16/14 [phone]

## 2014-12-16 NOTE — ED Notes (Signed)
Patient transported to MRI 

## 2014-12-16 NOTE — ED Notes (Signed)
MD at bedside. 

## 2014-12-16 NOTE — ED Notes (Signed)
Pt d/c instructions and medications reviewed.  Pt/caregiver verbalized understanding.

## 2014-12-16 NOTE — ED Notes (Signed)
Pt guarding leg.  Pt given pillows to prop leg.  Pt crying from pain.

## 2014-12-16 NOTE — Discharge Instructions (Signed)
Herniated Disk A herniated disk occurs when a disk in your spine bulges out too far. This condition is also called a ruptured disk or slipped disk. Your spine (backbone) is made up of bones called vertebrae. Between each pair of vertebrae is an oval disk with a soft, spongy center that acts as a shock absorber when you move. The spongy center is surrounded by a tough outer ring. When you have a herniated disk, the spongy center of the disk bulges out or ruptures through the outer ring. A herniated disk can press on a nerve between your vertebrae and cause pain. A herniated disk can occur anywhere in your back or neck area, but the lower back is the most common spot. CAUSES  In many cases, a herniated disk occurs just from getting older. As you age, the spongy insides of your disks tend to shrink and dry out. A herniated disk can result from gradual wear and tear. Injury or sudden strain can also cause a herniated disk.  RISK FACTORS Aging is the main risk factor for a herniated disk. Other risk factors include:  Being a man between the ages of 30 and 50 years.  Having a job that requires heavy lifting, bending, or twisting.  Having a job that requires long hours of driving.  Not getting enough exercise.  Being overweight.  Smoking. SIGNS AND SYMPTOMS  Signs and symptoms depend on which disk is herniated.  For a herniated disk in the lower back, you may have sharp pain in:  One part of your leg, hip, or buttocks.  The back of your calf.  The top or sole of your foot (sciatica).   For a herniated disk in the neck, you may feel pain:  When you move your neck.  Near or over your shoulder blade.  That moves to your upper arm, forearm, or fingers.   You may also have muscle weakness. It may be hard to:  Lift your leg or arm.  Stand on your toes.  Squeeze tightly with one of your hands.  Other symptoms can include:  Numbness or tingling in the affected areas of your  body.  Loss of bladder or bowel control. This is a rare but serious sign of a severe herniated disk in the lower back. DIAGNOSIS  Your health care provider will do a physical exam. During this exam, you may have to move certain body parts or assume various positions. For example, your health care provider may do the straight-leg test. This is a good way to test for a herniated disk in your lower back. In this test, the health care provider lifts your leg while you lie on your back. This is to see if you feel pain down your leg. Your health care provider will also check for numbness or loss of feeling.  Your health care provider will also check your:  Reflexes.  Muscle strength.  Posture.  Other tests may be done to help in making a diagnosis. These may include:  An X-ray of the spine to rule out other causes of back pain.   Other imaging studies, such as an MRI or CT scan. This is to check whether the herniated disk is pressing on your spinal canal.  Electromyography (EMG). This test checks the nerves that control muscles. It is sometimes used to identify the specific area of nerve involvement.  TREATMENT  In many cases, herniated disk symptoms go away over a period of days or weeks. You will most   likely be free of symptoms in 3-4 months. Treatment may include the following:  The initial treatment for a herniated disk is ashort period of rest.  Bed rest is often limited to 1 or 2 days. Resting for too long delays recovery.  If you have a herniated disk in your lower back, you should avoid sitting as much as possible because sitting increases pressure on the disk.  Medicines. These may include:   Nonsteroidal anti-inflammatory drugs (NSAIDs).  Muscle relaxants for back spasms.  Narcotic pain medicine if your pain is very bad.   Steroid injections. You may need these along the involved nerve root to help control pain. The steroid is injected in the area of the herniated disk.  It helps by reducing swelling around the disk.  Physical therapy. This may include exercises to strengthen the muscles that help support your spine.   You may need surgery if other treatments do not work.  HOME CARE INSTRUCTIONS Follow all your health care provider's instructions. These may include:  Take all medicines as directed by your health care provider.  Rest for 2 days and then start moving.  Do not sit or stand for long periods of time.  Maintain good posture when sitting and standing.  Avoid movements that cause pain, such as bending or lifting.  When you are able to start lifting things again:  Bend with your knees.  Keep your back straight.  Hold heavy objects close to your body.  If you are overweight, ask your health care provider to help you start a weight-loss program.  When you are able to start exercising, ask your health care provider how much and what type of exercise is best for you.  Work with a physical therapist on stretching and strengthening exercises for your back.  Do not wear high-heeled shoes.  Do not sleep on your belly.  Do not smoke.  Keep all follow-up visits as directed by your health care provider. SEEK MEDICAL CARE IF:  You have back or neck pain that is not getting better after 4 weeks.  You have very bad pain in your back or neck.  You develop numbness, tingling, or weakness along with pain. SEEK IMMEDIATE MEDICAL CARE IF:   You have numbness, tingling, or weakness that makes you unable to use your arms or legs.  You lose control of your bladder or bowels.  You have dizziness or fainting.  You have shortness of breath.  MAKE SURE YOU:   Understand these instructions.  Will watch your condition.  Will get help right away if you are not doing well or get worse. Document Released: 04/04/2000 Document Revised: 08/22/2013 Document Reviewed: 03/11/2013 Exit[employer] Patient Information 2015 [employer]. This information  is not intended to replace advice given to you by your health care provider. Make sure you discuss any questions you have with your health care provider.  

## 2014-12-16 NOTE — ED Notes (Signed)
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 She reports being unable to sit or bear weight on right side. She reports they have an MRI scheduled to rule out spinal involvement.

## 2015-01-23 ENCOUNTER — Ambulatory Visit (INDEPENDENT_AMBULATORY_CARE_PROVIDER_SITE_OTHER): Payer: BC Managed Care – PPO | Admitting: Sports Medicine

## 2015-01-23 ENCOUNTER — Encounter: Payer: Self-pay | Admitting: Sports Medicine

## 2015-01-23 VITALS — BP 136/78 | Ht 68.5 in | Wt 185.0 lb

## 2015-01-23 DIAGNOSIS — M48061 Spinal stenosis, lumbar region without neurogenic claudication: Secondary | ICD-10-CM | POA: Insufficient documentation

## 2015-01-23 DIAGNOSIS — M4806 Spinal stenosis, lumbar region: Secondary | ICD-10-CM

## 2015-01-23 DIAGNOSIS — M5116 Intervertebral disc disorders with radiculopathy, lumbar region: Secondary | ICD-10-CM

## 2015-01-23 NOTE — Progress Notes (Signed)
   Subjective:    Patient ID: Whitney Logan, female    DOB: 05-Feb-1966, 49 y.o.   MRN: [id number]  HPI 49 y/o female with history of spinal stenosis and L4-5 disc herniation who presents for another opinion on R>L hip pain and foot numbness.  This began in January when she could not lay on left side and started having paresthesias on her left leg in April.  She flew on a plane in June and had some severe back pain.  Has had paresthesias and right foot numbness that has been ongoing for past 3 weeks.  Was diagnosed with the stenosis and herniation in August based on exam and MRI results.  She saw her neurosurgeon, Dr. Lovell Sheehan, who recommended conservative management for now.  She would like a second opinion regarding helping with her hip pain and foot numbness.  She would like to avoid surgery, but would also like to avoid permanent neurological damage.    Past Hx Cervical disk w single level fusion/ Dr Lovell Sheehan this has done well  Soc. Hx:  Works as professor Agricultural consultant [employer]  ROS 1 Denies bowel or bladder incontinence.   Fever or recent UTI No abdominal pain NO GI infections  Review of Systems 2 MSK: +glute pain, denies weakness Neuro: +numbness, paresthesias    Objective:   Physical Exam  Constitutional: She is oriented to person, place, and time. She appears well-developed and well-nourished.  Neurological: She is alert and oriented to person, place, and time.  Skin: Skin is warm and dry.  Psychiatric: She has a normal mood and affect. Her behavior is normal. Judgment and thought content normal.   BP 136/78 mmHg  Ht 5' 8.5" (1.74 m)  Wt 185 lb (83.915 kg)  BMI 27.72 kg/m2  MSK: Spine- Inspection- well aligned, no erythema   Palpation- denies TTP along paraspinal muscles   ROM- decreased 2/2 pain in flexion and extension.  Pain exacerbated with extension and rotation to left.  Full ROM with rotation.   Muscle strength- 4/5 right hip flexors, right knee extension, right  knee flexion, 5/5 left hip flexors, left knee extension, 4/5 left knee flexion, 5/5 hip adduction bilaterally, 4/5 hip abduction bilaterally   SLR neg b/l, +foot drop on right with heel walking but mild, no foot drop on left, can walk on toes   Neurovascular- distal pulses intact, DTR's 1/4 left patellar and achilles, 2/2 right patellar and achilles    Reivew of MRI Large L4/5 disk fragment/ central L5/S1 some foraminal encroachment more on RT L2/3 and L3/4 show mild spinal stenosis w lack of clear space        Assessment & Plan:  See pt discharge instructions

## 2015-01-23 NOTE — Assessment & Plan Note (Signed)
Gabapentin is helping w few side effects Gradually increase this dose  Work closely with PT but try to steadily increase activity level at low pain threshold  Be aware of any progressive sxs as those indicate a surgical need  I think she is a good candidate for conservative care and PT  Reck in Nov by Dr Lovell Sheehan or myself

## 2015-01-23 NOTE — Patient Instructions (Signed)
Lumbar spinal stenosis/L4-5 disc herniation- Would recommend recumbent biking in PT, short walks daily.  Recommend home exercise program designed by PT.  Recommend good core exercises.     -Recommend pool exercises.   -3 more weeks of no strenuous exercise, no long distance walking.   -Let us or Dr. Lovell Sheehan know about any increase in foot drop, weakness.   -Can take OTC Vitamin B6 50-100mg  daily.   -Can increase neurontin to , with adding 300 mg at night.  If no improvement after a few days, can increase to , with adding  to morning dose.  If no improvement after a few day, can increase to 2700 mg, with doing 900 mg three times daily.   -Can try tramadol instead of oxycodone.

## 2015-02-11 ENCOUNTER — Emergency Department (HOSPITAL_COMMUNITY): Payer: BC Managed Care – PPO

## 2015-02-11 ENCOUNTER — Emergency Department (HOSPITAL_COMMUNITY)
Admission: EM | Admit: 2015-02-11 | Discharge: 2015-02-11 | Disposition: A | Discharge status: Home / Self Care | Payer: BC Managed Care – PPO | Attending: Emergency Medicine | Admitting: Emergency Medicine

## 2015-02-11 ENCOUNTER — Other Ambulatory Visit: Payer: Self-pay

## 2015-02-11 ENCOUNTER — Encounter (HOSPITAL_COMMUNITY): Payer: Self-pay | Admitting: Oncology

## 2015-02-11 DIAGNOSIS — G8929 Other chronic pain: Secondary | ICD-10-CM | POA: Insufficient documentation

## 2015-02-11 DIAGNOSIS — Z79899 Other long term (current) drug therapy: Secondary | ICD-10-CM | POA: Diagnosis not present

## 2015-02-11 DIAGNOSIS — Z981 Arthrodesis status: Secondary | ICD-10-CM | POA: Diagnosis not present

## 2015-02-11 DIAGNOSIS — M545 Low back pain: Secondary | ICD-10-CM | POA: Diagnosis not present

## 2015-02-11 DIAGNOSIS — R0789 Other chest pain: Secondary | ICD-10-CM | POA: Insufficient documentation

## 2015-02-11 DIAGNOSIS — M5410 Radiculopathy, site unspecified: Secondary | ICD-10-CM | POA: Insufficient documentation

## 2015-02-11 DIAGNOSIS — M4802 Spinal stenosis, cervical region: Secondary | ICD-10-CM | POA: Diagnosis not present

## 2015-02-11 DIAGNOSIS — R079 Chest pain, unspecified: Secondary | ICD-10-CM | POA: Diagnosis present

## 2015-02-11 DIAGNOSIS — M792 Neuralgia and neuritis, unspecified: Secondary | ICD-10-CM

## 2015-02-11 LAB — BASIC METABOLIC PANEL
Anion gap: 13 (ref 5–15)
BUN: 6 mg/dL (ref 6–20)
CO2: 22 mmol/L (ref 22–32)
Calcium: 9.2 mg/dL (ref 8.9–10.3)
Chloride: 103 mmol/L (ref 101–111)
Creatinine, Ser: 0.85 mg/dL (ref 0.44–1.00)
GFR calc Af Amer: >60 mL/min (ref >60)
GLUCOSE: 147 mg/dL — AB (ref 65–99)
POTASSIUM: 3.8 mmol/L (ref 3.5–5.1)
Sodium: 138 mmol/L (ref 135–145)

## 2015-02-11 LAB — CBC
HEMATOCRIT: 42.2 % (ref 36.0–46.0)
Hemoglobin: 13.9 g/dL (ref 12.0–15.0)
MCH: 30.1 pg (ref 26.0–34.0)
MCHC: 32.9 g/dL (ref 30.0–36.0)
MCV: 91.3 fL (ref 78.0–100.0)
Platelets: 385 10*3/uL (ref 150–400)
RBC: 4.62 MIL/uL (ref 3.87–5.11)
RDW: 13.6 % (ref 11.5–15.5)
WBC: 14.1 10*3/uL — ABNORMAL HIGH (ref 4.0–10.5)

## 2015-02-11 LAB — I-STAT TROPONIN, ED: Troponin i, poc: 0 ng/mL (ref 0.00–0.08)

## 2015-02-11 NOTE — ED Notes (Signed)
Lights dimmed for patient comfort.Pt denies further needs at this time. VSS. Pt updated about awaiting for CXR. Will continue to monitor.

## 2015-02-11 NOTE — ED Notes (Signed)
Pt woke up with generalized chest pain w/ radiation to both arms as well as back pain.  Pt describes the pain as a burning sensation. Rates pain 5/10.

## 2015-02-11 NOTE — ED Notes (Signed)
Pt transported to Xray. 

## 2015-02-11 NOTE — ED Provider Notes (Signed)
CSN: [id number]     Arrival date & time 02/11/15  0542 History   First MD Initiated Contact with Patient 02/11/15 [phone]     Chief Complaint  Patient presents with  . Chest Pain     (Consider location/radiation/quality/duration/timing/severity/associated sxs/prior Treatment) HPI Comments: Pt isa 49 yo female with PMH of spinal stenosis (s/p cervical fusion) and L4-5 herniated disc who presents the ED with complaint of chest pain. Patient states when she woke up yesterday morning she started having "burning" pain to her left shoulder and chest. She notes the pain is worsened with movement and light touch to her chest and upper left arm. She notes this pain radiates from her left neck to her anterior left chest wall and left arm extending to elbow. Pt states that her pain feels similar to her lumbar/leg neuropathic pain. She states she has noticed mild weakness to her right and left arms. Endorses chronic back pain associated with L4-5 herniated disc. Denies fever, chills, diaphoresis, visual changes, neck stiffness, SOB, palpitations, abdominal pain, N/V/D, numbness, tingling. Pt reports she has an appointment with her neurosurgeon (Dr. Lovell Sheehan) tomorrow.   Past Medical History  Diagnosis Date  . Allergy     hayfever    Past Surgical History  Procedure Laterality Date  . Cesarean section  2005/2010  . Cervical fusion     Family History  Problem Relation Age of Onset  . Heart attack Father     12/2010  . COPD Maternal Grandfather   . Heart failure Paternal Grandmother    Social History  Substance Use Topics  . Smoking status: Never Smoker   . Smokeless tobacco: Never Used  . Alcohol Use: No   OB History    Gravida Para Term Preterm AB TAB SAB Ectopic Multiple Living   4 2             Review of Systems  Cardiovascular: Positive for chest pain.  Musculoskeletal: Positive for myalgias, back pain and neck pain.  Neurological: Positive for weakness.      Allergies  Seasonal  ic; Food; and Peanuts  Home Medications   Prior to Admission medications   Medication Sig Start Date End Date Taking? Authorizing Provider  acyclovir (ZOVIRAX) 800 MG tablet Take 800 mg by mouth daily as needed (out breaks).    Yes Historical Provider, MD  diphenhydrAMINE (BENADRYL) 25 MG tablet Take 25 mg by mouth daily.   Yes Historical Provider, MD  fexofenadine (ALLEGRA) 180 MG tablet Take 180 mg by mouth daily.   Yes Historical Provider, MD  gabapentin (NEURONTIN) 300 MG capsule Take 600-900 mg by mouth 3 (three) times daily. TAKE 900 MG IN THE MORNING , 600 MG AT LUNCH AND  THEN 600 MG AT BEDTIME 01/31/15  Yes Historical Provider, MD  JUNEL FE 1/20 1-20 MG-MCG tablet PT TO TAKE 1 TABLET DAILY FOR 12 CONSECUTIVE WEEKS OF ACTIVE PILLS 03/01/12  Yes Silverio Lay, MD  naproxen sodium (ANAPROX) 220 MG tablet Take 440 mg by mouth 2 (two) times daily between meals as needed (FOR HEADACHES).    Yes Historical Provider, MD  methylPREDNISolone (MEDROL DOSEPAK) 4 MG TBPK tablet Take as directed Patient not taking: Reported on 02/11/2015 12/16/14   Linwood Dibbles, MD  oxyCODONE (ROXICODONE) 5 MG immediate release tablet Take 1-2 tablets (5-10 mg total) by mouth every 4 (four) hours as needed for severe pain. Patient not taking: Reported on 02/11/2015 12/16/14   Linwood Dibbles, MD  valACYclovir (VALTREX) 500 MG tablet Take  1 tablet (500 mg total) by mouth 2 (two) times daily. Patient not taking: Reported on 12/16/2014 11/05/11   Silverio Lay, MD   BP 148/88 mmHg  Pulse 73  Temp(Src) 97.7 F (36.5 C) (Oral)  Resp 15  Ht  (1.727 m)  Wt 185 lb (83.915 kg)  BMI 28.14 kg/m2  SpO2 99% Physical Exam  Constitutional: She is oriented to person, place, and time. She appears well-developed and well-nourished. No distress.  HENT:  Head: Normocephalic and atraumatic.  Nose: Nose normal. Right sinus exhibits no maxillary sinus tenderness and no frontal sinus tenderness. Left sinus exhibits no maxillary sinus  tenderness and no frontal sinus tenderness.  Mouth/Throat: Uvula is midline, oropharynx is clear and moist and mucous membranes are normal. No oropharyngeal exudate.  Eyes: Conjunctivae and EOM are normal. Pupils are equal, round, and reactive to light. Right eye exhibits no discharge. Left eye exhibits no discharge. No scleral icterus.  Neck: Normal range of motion. Neck supple.  Cardiovascular: Normal rate, regular rhythm, normal heart sounds and intact distal pulses.   No murmur heard. Pulmonary/Chest: Effort normal and breath sounds normal. No respiratory distress. She has no wheezes. She has no rales. She exhibits tenderness (left chest wall tender with light touch).  Abdominal: Soft. Bowel sounds are normal. She exhibits no distension and no mass. There is no tenderness. There is no rebound and no guarding.  Musculoskeletal: Normal range of motion. She exhibits tenderness. She exhibits no edema.       Cervical back: She exhibits tenderness. She exhibits normal range of motion, no bony tenderness, no swelling, no edema, no deformity, no laceration and no spasm.       Thoracic back: Normal. She exhibits normal range of motion, no tenderness, no bony tenderness, no swelling, no edema, no deformity, no laceration and no spasm.       Lumbar back: She exhibits tenderness (pt reports pain is consistent with chronic low back pain). She exhibits normal range of motion, no bony tenderness, no swelling, no edema, no deformity, no laceration and no spasm.  No C/T/L midline tenderness. FROM of neck, mild tenderness along bilateral cervical paraspinal muscles. FROM of bilateral arms, 5/5 strength, 2+ radial pulses, sensation intact. Equal grip strength. FROM of spine, pt able to stand and ambulate, no ataxia.  Lymphadenopathy:    She has no cervical adenopathy.  Neurological: She is alert and oriented to person, place, and time. She has normal strength. No cranial nerve deficit or sensory deficit.  Coordination and gait normal.  Skin: Skin is warm and dry. She is not diaphoretic.  Nursing note and vitals reviewed.   ED Course  Procedures (including critical care time) Labs Review Labs Reviewed  BASIC METABOLIC PANEL - Abnormal; Notable for the following:    Glucose, Bld 147 (*)    All other components within normal limits  CBC - Abnormal; Notable for the following:    WBC 14.1 (*)    All other components within normal limits  I-STAT TROPOININ, ED    Imaging Review Dg Chest 2 View  02/11/2015  CLINICAL DATA:  Acute onset of generalized chest pain and burning. Initial encounter. EXAM: CHEST  2 VIEW COMPARISON:  None. FINDINGS: The lungs are well-aerated and clear. There is no evidence of focal opacification, pleural effusion or pneumothorax. The heart is normal in size; the mediastinal contour is within normal limits. No acute osseous abnormalities are seen. Cervical spinal fusion hardware is noted. IMPRESSION: No acute cardiopulmonary process seen.  Electronically Signed   By: Roanna RaiderJeffery  Chang M.D.   On: 02/11/2015 06:53   I have personally reviewed and evaluated these images and lab results as part of my medical decision-making.   EKG Interpretation   Date/Time:  Sunday February 11 2015 05:58:32 EDT Ventricular Rate:  82 PR Interval:  149 QRS Duration: 97 QT Interval:  384 QTC Calculation: 448 R Axis:   85 Text Interpretation:  Sinus rhythm RSR' in V1 or V2, right VCD or RVH No  old tracing to compare Confirmed by [facility] HospitalWOFFORD  MD, TREY ([id number]) on 02/11/2015  8:01:31 AM      MDM   Final diagnoses:  Radicular pain in left arm  Cervical stenosis of spine   Pt presents with "burning" pain that radiates down her left neck to her left anterior chest and left upper arm with associated weakness. No fever, numbness, tingling, SOB, cough. Denies any recent fall, trauma or injury. Hx of cervical stenosis (s/p cervical fusion). VSS. Exam revealed left upper chest wall tender to light  touch. TTP at cervical paraspinal muscles. 5/5 strength of bilateral arms, neurovascularly intact. No rash present. EKG shows normal sinus rhythm. Trop negative. Labs unremarkable. CXR negative. I suspect pt's sxs are likely due to a cervical radiculopathy. I have a low suspicion for ACS, PE, dissection, or other acute cardiac event at this time. I do not suspect shingles, no rash present on exam. Plan to d/c pt home. Advised pt to continue taking her Gabapentin and to use tylenol for additional pain relief. Pt reports she has an appointment tomorrow with her neurosurgeon.   Evaluation does not show pathology requring ongoing emergent intervention or admission. Pt is hemodynamically stable and mentating appropriately. Discussed findings/results and plan with patient/guardian, who agrees with plan. All questions answered. Return precautions discussed and outpatient follow up given.        Satira Sarkicole Elizabeth HattiesburgNadeau, [place] JerseyPA-C 02/11/15 [id number]  Shon Batonourtney F Horton, MD 02/11/15 [phone]

## 2015-02-11 NOTE — Discharge Instructions (Signed)
Continue taking your prescription of Gabapentin as prescribed. You may also take Tylenol for pain relief as needed. Follow up with your neurosurgeon, Dr. Lovell SheehanJenkins at your scheduled appointment tomorrow. Please return to the Emergency Department if symptoms worsen or new onset of fever, chills, numbness, tingling, weakness, difficulty breathing.

## 2016-02-13 ENCOUNTER — Ambulatory Visit: Payer: Self-pay

## 2016-02-13 ENCOUNTER — Ambulatory Visit (INDEPENDENT_AMBULATORY_CARE_PROVIDER_SITE_OTHER): Payer: BC Managed Care – PPO | Admitting: Sports Medicine

## 2016-02-13 ENCOUNTER — Encounter: Payer: Self-pay | Admitting: Sports Medicine

## 2016-02-13 VITALS — BP 114/62 | Ht 73.23 in | Wt 180.0 lb

## 2016-02-13 DIAGNOSIS — M79671 Pain in right foot: Secondary | ICD-10-CM | POA: Diagnosis not present

## 2016-02-13 DIAGNOSIS — M722 Plantar fascial fibromatosis: Secondary | ICD-10-CM | POA: Insufficient documentation

## 2016-02-13 NOTE — Progress Notes (Signed)
  Whitney Logan - 50 y.o. female MRN [id number]  Date of birth: October 02, 1965  SUBJECTIVE:  Including CC & ROS.  Chief Complaint  Patient presents with  . Foot Pain    Whitney Logan is a 50 year old female that is presenting with right heel pain. This pain started about 6 weeks ago and became significant about 3 weeks ago. She replaced her orthotics with heel pads and has had no change in her symptoms. She feels the pain the most with walking when she is landing on her heel. She has pain in the morning but is not every morning. The pain usually improves throughout the day but worse with prolonged standing. The heel feels as if it is bruised in nature. This started in a gradual session. She's been in physical therapy since January for her back. She has a history of microdiscectomy and laminectomy of her L4-L5 in November of last year. She eventually had right foot drop as well as numbness over her right great toe. She also had some numbness in the lateral aspect of her right lower leg prior to receiving the surgery.  ROS: No unexpected weight loss, fever, chills, swelling, instability,  redness, otherwise see HPI    HISTORY: Past Medical, Surgical, Social, and Family History Reviewed & Updated per EMR.   Pertinent Historical Findings include: PMSHx -  B/l nerve impingement of the hips. 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  Psych:  alert and oriented Right foot:  Tenderness to the plantar aspect of the  calcaneus. No significant tenderness to the insertion of the Achilles. There is no tenderness overlying the Achilles. No pain to palpation over the mid foot to suggest plantar fasciitis. Normal range of motion of the ankle. Normal strength Normal gait  Limited ultrasound: Right foot: The acuities appears normal in long axis. The plantar fascia appears to be normal with at 0.5 mm. There is a spur on the anterior aspect of the calcaneus. There is minimal hypoechoic change over the point of maximal tenderness on her calcaneus to suggest slight effusion.   ASSESSMENT & PLAN:   Right foot pain The pain is most likely consistent with contusion to the soft tissue of the right heel or related to the spur of the calcaneus. It does not appear to be related to plantar fasciitis. - She was given exercises for plantar fasciitis  - She is to follow-up with me in order to have custom orthotics made.

## 2016-02-13 NOTE — Assessment & Plan Note (Signed)
The pain is most likely consistent with contusion to the soft tissue of the right heel or related to the spur of the calcaneus. It does not appear to be related to plantar fasciitis. - She was given exercises for plantar fasciitis  - She is to follow-up with me in order to have custom orthotics made.

## 2016-02-19 ENCOUNTER — Ambulatory Visit (INDEPENDENT_AMBULATORY_CARE_PROVIDER_SITE_OTHER): Payer: BC Managed Care – PPO | Admitting: Family Medicine

## 2016-02-19 DIAGNOSIS — M79671 Pain in right foot: Secondary | ICD-10-CM | POA: Diagnosis not present

## 2016-02-21 NOTE — Progress Notes (Signed)
  Whitney PiggLaurie W Logan - 50 y.o. female MRN [id number]  Date of birth: [birth year]  SUBJECTIVE:  Including CC & ROS.  Chief Complaint  Patient presents with  . Foot Pain    Whitney Logan is a [age] yo that is following up for right foot pain. She was seen on 02/13/16 for pain of her heel. It is thought that she was likely having a contusion of the soft tissue in that area with pain associated with the spur of her calcaneus. She is here today to have custom orthotics made.  ROS: No unexpected weight loss, fever, chills, swelling, instability, muscle pain, numbness/tingling, redness, otherwise see HPI   HISTORY: Past Medical, Surgical, Social, and Family History Reviewed & Updated per EMR.   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   PHYSICAL EXAM:  VS: BP:124/78  HR:76bpm  TEMP: ( )  RESP:   HT:5\' 8"  (172.7 cm)   WT:180 lb (81.6 kg)  BMI:27.4 PHYSICAL EXAM: Gen: NAD, alert, cooperative with exam, well-appearing HEENT: clear conjunctiva, EOMI CV:  no edema, capillary refill brisk,  Resp: non-labored, normal speech Skin: no rashes, normal turgor  Neuro: no gross deficits.  Psych:  alert and oriented Right foot:  TTP of the plantar aspect of the calcaneous  Gait:  After completion of the orthotics her gait appeared to be in neutral strike.  ASSESSMENT & PLAN:   Right foot pain She was fitted for custom orthotics today. - she is advised to follow up if any adjustments need to be made. Possible for needing medial heel wedge.   Patient was fitted for a standard, cushioned, semi-rigid orthotic. The orthotic was heated and afterward the patient stood on the orthotic blank positioned on the orthotic stand. The patient was positioned in subtalar neutral position and 10 degrees of ankle dorsiflexion in a weight bearing stance. After completion of molding, a stable base was applied to the orthotic blank. The blank was ground to a stable  position for weight bearing. Size: 8 Base: Blue EVA Additional Posting and Padding: None The patient ambulated these, and they were very comfortable.  I spent 40 minutes with this patient, greater than 50% was face-to-face time counseling regarding the below diagnosis.

## 2016-02-21 NOTE — Assessment & Plan Note (Signed)
She was fitted for custom orthotics today. - she is advised to follow up if any adjustments need to be made. Possible for needing medial heel wedge.   Patient was fitted for a standard, cushioned, semi-rigid orthotic. The orthotic was heated and afterward the patient stood on the orthotic blank positioned on the orthotic stand. The patient was positioned in subtalar neutral position and 10 degrees of ankle dorsiflexion in a weight bearing stance. After completion of molding, a stable base was applied to the orthotic blank. The blank was ground to a stable position for weight bearing. Size: 8 Base: Blue EVA Additional Posting and Padding: None The patient ambulated these, and they were very comfortable.  I spent 40 minutes with this patient, greater than 50% was face-to-face time counseling regarding the below diagnosis.

## 2016-04-21 HISTORY — PX: LUMBAR DISC SURGERY: SHX700

## 2016-07-17 ENCOUNTER — Ambulatory Visit (INDEPENDENT_AMBULATORY_CARE_PROVIDER_SITE_OTHER): Payer: BC Managed Care – PPO | Admitting: Sports Medicine

## 2016-07-17 ENCOUNTER — Encounter: Payer: Self-pay | Admitting: Sports Medicine

## 2016-07-17 DIAGNOSIS — M722 Plantar fascial fibromatosis: Secondary | ICD-10-CM | POA: Diagnosis not present

## 2016-07-17 MED ORDER — TRIAMCINOLONE ACETONIDE 10 MG/ML IJ SUSP
10.0000 mg | Freq: Once | INTRAMUSCULAR | Status: AC
  Administered 2016-07-17: 10 mg via INTRA_ARTICULAR

## 2016-07-17 NOTE — Progress Notes (Signed)
   Subjective:    Patient ID: Whitney Logan, female    DOB: [birth year], 51 y.o.   MRN: [id number]  HPI 51 y/o female with history of spinal stenosis and L4-5 disc herniation who is presenting for follow up of right foot pain secondary to spur on the calcaneus. She had custom orthotics made on 10/312017. Reports that this has not improved her pain at all. She has pain with walking, mainly when she lands on her heel and sometimes at rest (especially if she has been active). Pain is worse in the morning. Feels like she is stepping on a pebble constantly. She reports that she had a foot drop with numbness of her big right toe prior to her surgery that likely set off her pain. She no longer has a foot drop but does occasionally have right toe numbness that has started to return.  Past Hx Cervical disk w single level fusion/ Dr Lovell SheehanJenkins/ this has done well L4-L5 Microdiscectomy and laminectomy in November 2016 Soc. Hx:  Works as professor Agricultural [employer]  ROS 1 Denies bowel or bladder incontinence.   Fever or recent UTI No abdominal pain NO GI infections  Review of Systems 2 MSK: +foot pain, denies weakness Neuro: +numbness, paresthesias in toe    Objective:   Physical Exam   BP 110/70   Ht 5' 8.5" (1.74 m)   Wt 190 lb (86.2 kg)   BMI 28.47 kg/m   Gen: NAD, alert, cooperative with exam, well-appearing HEENT: clear conjunctiva, EOMI CV:  no edema, capillary refill brisk,  Resp: non-labored, normal speech Skin: no rashes, normal turgor  Neuro: no gross deficits.  Psych:  alert and oriented  Right Foot:  Tenderness to the plantar aspect of the calcaneus. No significant tenderness to the insertion of the Achilles. There is no tenderness overlying the Achilles.  MIld pain with dorsiflexion. No pain to palpation over the mid foot. Normal range of motion of the ankle. Normal strength Normal gait.  Limited ultrasound: Right foot: TThe plantar fascia appears to be thickened  from prior (0.67 mm, increased from 0.5 mm). There is a spur on the anterior aspect of the calcaneus. There is hypoechoic change over the point of maximal tenderness on her calcaneus to suggest effusion.     Assessment & Plan:  Right foot pain, secondary to calcaneous spur - injected kenalog/lidocaine into plantar fascia near calcaneous insertion - continue wearing custom orthotics - return in 1 month if no improvement, will next target nerve irritation as her level of pain does appears to be worse than findings on ultrasound and may have nerve component given history of radiculopathy  I observed and examined the patient with the resident and agree with assessment and plan.  Note reviewed and modified by me.  Enid BaasKarl Fields, MD

## 2016-07-17 NOTE — Assessment & Plan Note (Signed)
Trial w injection since not enough relief with orthotics  Procedure:  Injection of RT Plantar fascia Consent obtained and verified. Time-out conducted. Noted no overlying erythema, induration, or other signs of local infection. Skin prepped in a sterile fashion. Topical analgesic spray: Ethyl chloride. Completed without difficulty. Location was from medial foot above level of PF to area of spur Meds: 1cc kenalog 10 and 1.5 cc lidocaine Pain immediately improved suggesting accurate placement of the medication. Advised to call if fevers/chills, erythema, induration, drainage, or persistent bleeding.  Cont on exercises and stretches  Padding added to heel of RT orthotic with cutout  Reck 2 mos

## 2017-07-02 IMAGING — CR DG LUMBAR SPINE COMPLETE 4+V
5 series · 5 of 5 positions shown · non-contrast
Comparison: None.

CLINICAL DATA: Chronic lower back pain with left-sided sciatica. No
known injury.

EXAM:
LUMBAR SPINE - COMPLETE 4+ VIEW

[w lumbar spine ap]
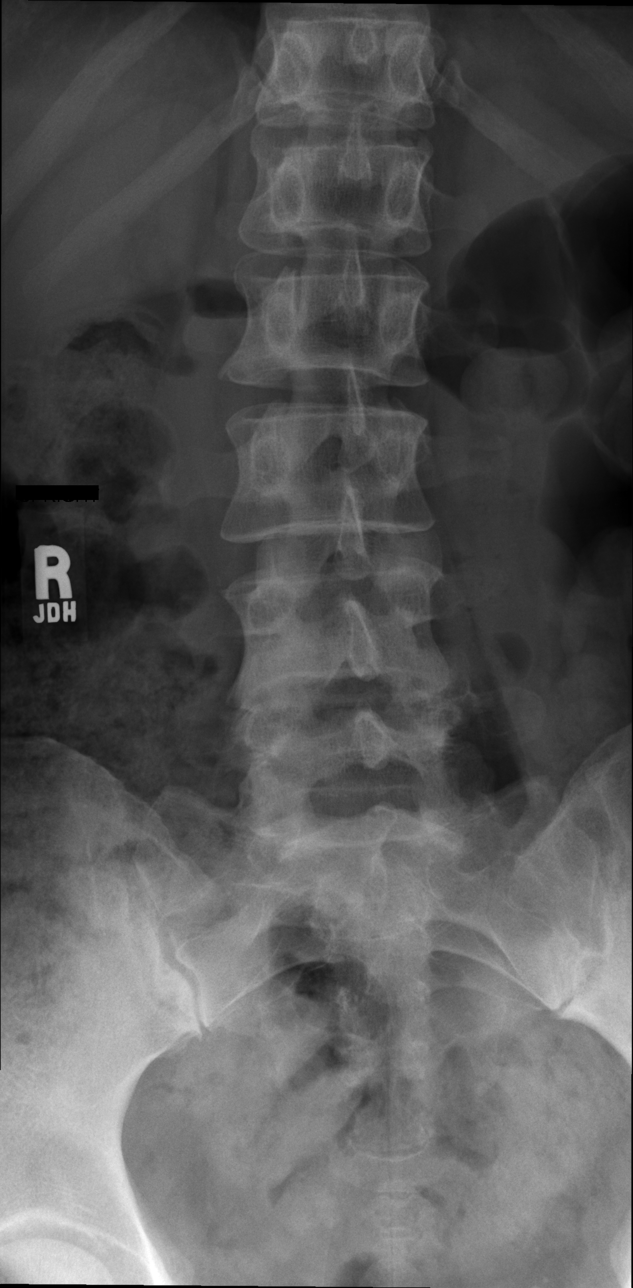

[w lumbar spine obl (1 of 2)]
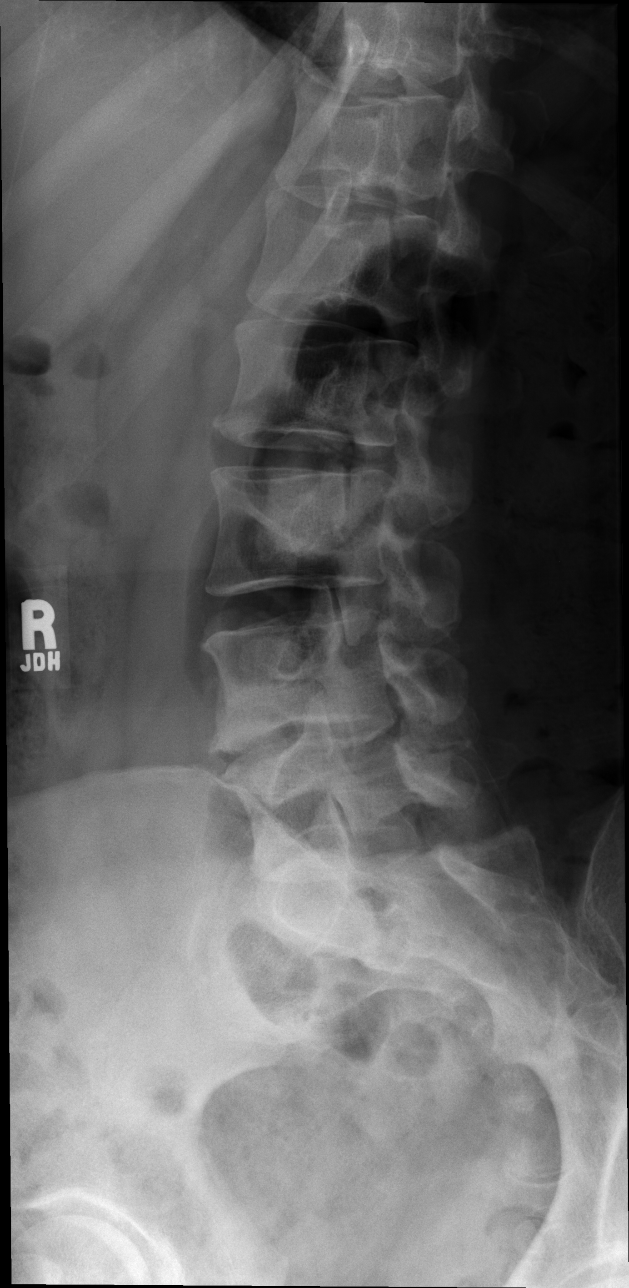

[w lumbar spine obl (2 of 2)]
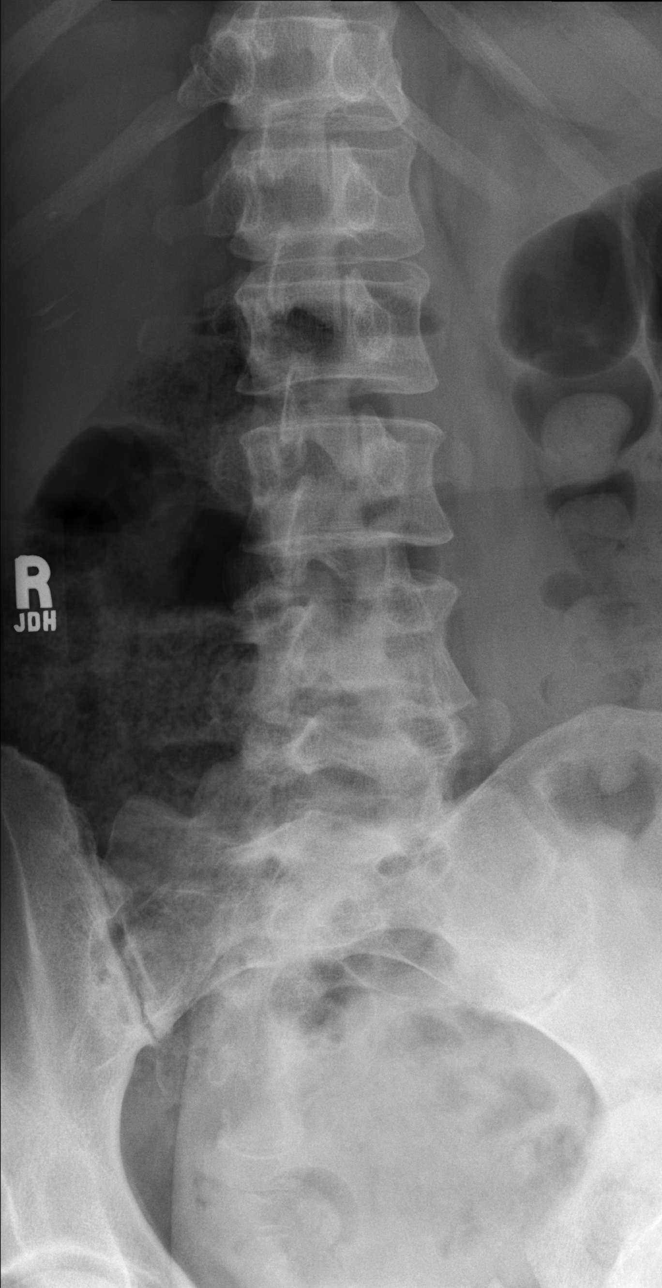

[w lumbar spine lat]
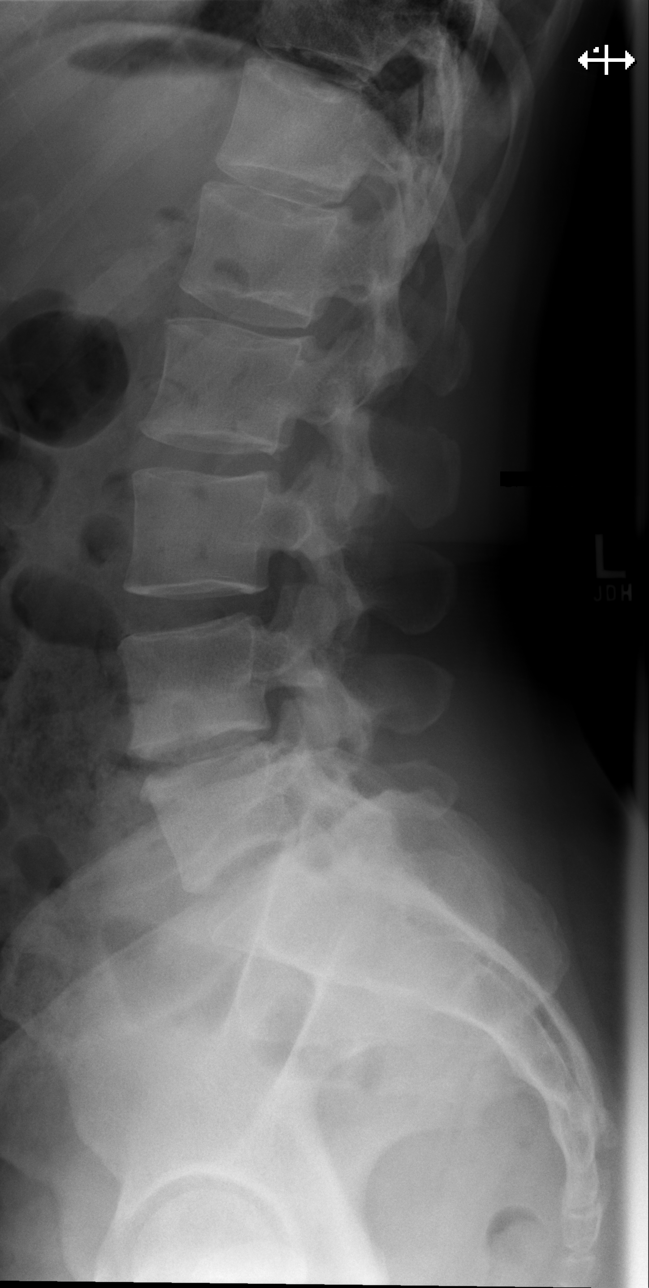

[w lumbar l-5 s-1 spot]
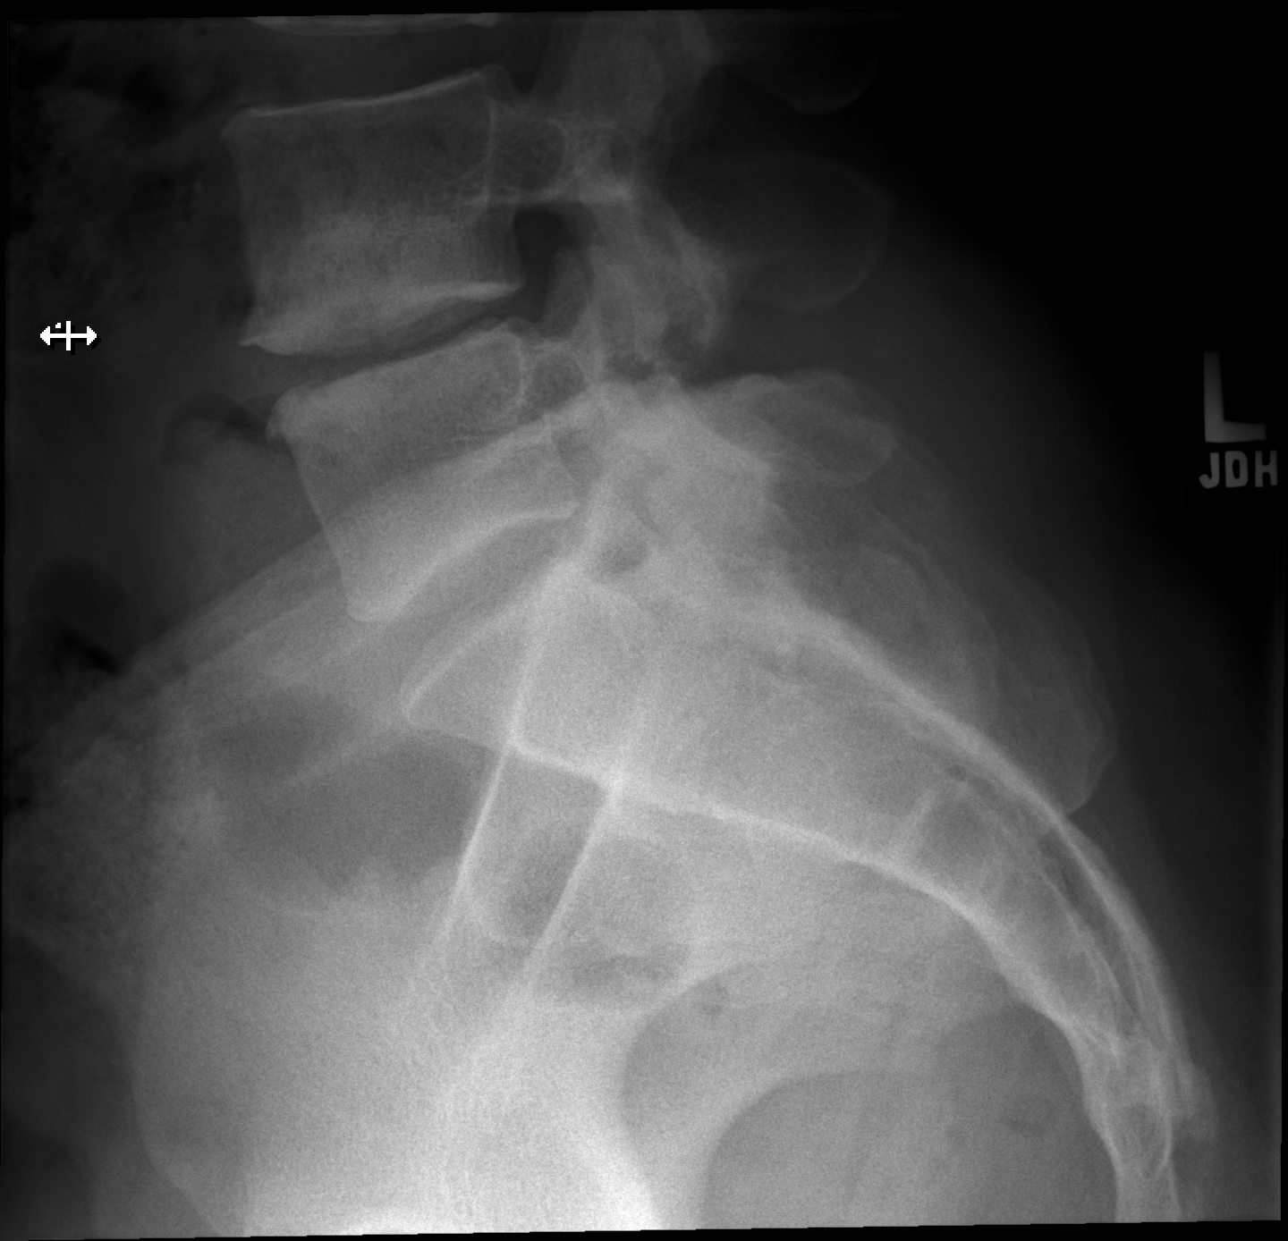

[5 of 5 positions shown; findings below may reference images not displayed]

FINDINGS: No fracture or spondylolisthesis is noted. Moderate degenerative
disc disease is noted at L4-5. Posterior facet joints appear normal.
IMPRESSION: Moderate degenerative disc disease is noted at L4-5. No acute
abnormality seen in the lumbar spine.

## 2019-06-18 ENCOUNTER — Ambulatory Visit: Payer: BC Managed Care – PPO | Attending: Internal Medicine

## 2019-06-18 DIAGNOSIS — Z23 Encounter for immunization: Secondary | ICD-10-CM | POA: Insufficient documentation

## 2019-06-18 NOTE — Progress Notes (Signed)
   Covid-19 Vaccination Clinic  Name:  Whitney Logan    MRN: [id number] DOB: May 06, 1965  06/18/2019  [name] was observed post Covid-19 immunization for 15 minutes without incidence. She was provided with Vaccine Information Sheet and instruction to access the V-Safe system.   [name] was instructed to call 911 with any severe reactions post vaccine: Marland Kitchen Difficulty breathing  . Swelling of your face and throat  . A fast heartbeat  . A bad rash all over your body  . Dizziness and weakness    Immunizations Administered    Name Date Dose VIS Date Route   Pfizer COVID-19 Vaccine 06/18/2019  5:12 PM 0.3 mL 04/01/2019 Intramuscular   Manufacturer: [employer], [employer]   Lot: [id number]   NDC: [address]-7

## 2019-07-09 ENCOUNTER — Ambulatory Visit: Payer: BC Managed Care – PPO | Attending: Internal Medicine

## 2019-07-09 DIAGNOSIS — Z23 Encounter for immunization: Secondary | ICD-10-CM

## 2019-07-09 NOTE — Progress Notes (Signed)
   Covid-19 Vaccination Clinic  Name:  Whitney Logan    MRN: [id number] DOB: 1965/12/31  07/09/2019  [name] was observed post Covid-19 immunization for 15 minutes without incident. She was provided with Vaccine Information Sheet and instruction to access the V-Safe system.   [name] was instructed to call 911 with any severe reactions post vaccine: Marland Kitchen Difficulty breathing  . Swelling of face and throat  . A fast heartbeat  . A bad rash all over body  . Dizziness and weakness   Immunizations Administered    Name Date Dose VIS Date Route   Pfizer COVID-19 Vaccine 07/09/2019  9:41 AM 0.3 mL 04/01/2019 Intramuscular   Manufacturer: [employer], [employer]   Lot: [id number]   NDC: [address]-7

## 2023-02-12 ENCOUNTER — Telehealth: Payer: Self-pay | Admitting: Gastroenterology

## 2023-02-12 NOTE — Telephone Encounter (Signed)
Ok to schedule in APP clinic RG

## 2023-02-12 NOTE — Telephone Encounter (Signed)
Good morning Dr. Chales Abrahams,   Supervising Provider 10/24 AM   We received a call from this patient wishing to schedule a colonoscopy. Patient last had colonoscopy with Dr. Loreta Ave in 06/2016. She stated she had a bad experience and would like to establish care with [facility]. Records from previous colonoscopy were obatined and scanned into Media for you to review. Would you please advise on scheduling?  Thank you.

## 2023-02-17 NOTE — Telephone Encounter (Signed)
Left voicemail for patient to call back and schedule OV with APP

## 2023-03-12 ENCOUNTER — Encounter: Payer: Self-pay | Admitting: Gastroenterology

## 2023-03-12 ENCOUNTER — Ambulatory Visit: Payer: Self-pay | Admitting: Gastroenterology

## 2023-03-12 VITALS — BP 100/0 | HR 84 | Ht 67.0 in | Wt 188.1 lb

## 2023-03-12 DIAGNOSIS — Z860100 Personal history of colon polyps, unspecified: Secondary | ICD-10-CM

## 2023-03-12 DIAGNOSIS — K5909 Other constipation: Secondary | ICD-10-CM

## 2023-03-12 DIAGNOSIS — K581 Irritable bowel syndrome with constipation: Secondary | ICD-10-CM

## 2023-03-12 MED ORDER — SUTAB 1479-225-188 MG PO TABS: 24.0000 | ORAL_TABLET | ORAL | 0 refills | Status: DC

## 2023-03-12 NOTE — Patient Instructions (Addendum)
We have sent the following medications to your pharmacy for you to pick up at your convenience: Sutab  We have given you samples of the following medication to take: Linzess   Two days before your procedure: Mix 3 packs (or capfuls) of Miralax in 48 ounces of clear liquid and drink at 6pm.  You have been scheduled for a colonoscopy. Please follow written instructions given to you at your visit today.   Please pick up your prep supplies at the pharmacy within the next 1-3 days.  If you use inhalers (even only as needed), please bring them with you on the day of your procedure.  DO NOT TAKE 7 DAYS PRIOR TO TEST- Trulicity (dulaglutide) Ozempic, Wegovy (semaglutide) Mounjaro (tirzepatide) Bydureon Bcise (exanatide extended release)  DO NOT TAKE 1 DAY PRIOR TO YOUR TEST Rybelsus (semaglutide) Adlyxin (lixisenatide) Victoza (liraglutide) Byetta (exanatide) ___________________________________________________________________________ _______________________________________________________  If your blood pressure at your visit was 140/90 or greater, please contact your primary care physician to follow up on this.  _______________________________________________________  If you are age 58 or older, your body mass index should be between 23-30. Your Body mass index is 29.46 kg/m. If this is out of the aforementioned range listed, please consider follow up with your Primary Care Provider.  If you are age 49 or younger, your body mass index should be between 19-25. Your Body mass index is 29.46 kg/m. If this is out of the aformentioned range listed, please consider follow up with your Primary Care Provider.   ________________________________________________________  The [place] [facility] providers would like to encourage you to use [facility] to communicate with providers for non-urgent requests or questions.  Due to long hold times on the telephone, sending your provider a message by [facility]  may be a faster and more efficient way to get a response.  Please allow 48 business hours for a response.  Please remember that this is for non-urgent requests.  _______________________________________________________  Thank you,  Dr. Lynann Bologna

## 2023-03-12 NOTE — Progress Notes (Signed)
Chief Complaint: For colon  Referring Provider:  Melida Quitter, MD      ASSESSMENT AND PLAN;   #1. H/O polyps  #2. Chronic constipation/IBS-C  Plan: -Colon with 2 day prep (sutabs and miralax) -Linzess 75 mcg po every day (samples given) -Continue Mg/stool softeners -Minimize nonsteroidals. -Increase water intake.  Continue exercising.   Discussed risks & benefits of colonoscopy. Risks including rare perforation req laparotomy, bleeding after bx/polypectomy req blood transfusion, rarely missing neoplasms, risks of anesthesia/sedation, rare risk of damage to internal organs. Benefits outweigh the risks. Patient agrees to proceed. All the questions were answered. Pt consents to proceed.  HPI:    Whitney Logan is a 57 y.o. 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 They report a transit time of every other to every third day, despite daily intake of stool softeners and magnesium, and a diet rich in fiber and whole grains. The patient denies constipation, describing the stool as a 'solid log' rather than hard. They report feeling bloated and uncomfortable, often experiencing pressure up to their rib cage if they haven't had a bowel movement for a couple of days.  The patient has had several colonoscopies in the past, with the most recent one revealing a polyp that was lost before it could be tested. They report difficulty with the prep for the procedure, often resulting in vomiting with Golyte.  The patient also mentions occasional headaches and neck pain, for which they take sodium naproxen as needed. They acknowledge a high caffeine intake, mostly in the morning, and drink flavored water in the afternoon.  The patient's bowel irregularity has been a lifelong issue, only improving during their pregnancies. They express frustration with their irregularity,  comparing it to their husband's and son's regular bowel movements. Their daughter, however, also struggles with irregularity. The patient is open to trying new medications to help regulate their bowel movements.   No nausea, vomiting, heartburn, regurgitation, odynophagia or dysphagia.  No diarrhea.  No melena or hematochezia. No unintentional weight loss. No abdominal pain.     Wt Readings from Last 3 Encounters:  03/12/23 188 lb 2 oz (85.3 kg)  07/17/16 190 lb (86.2 kg)  02/19/16 180 lb (81.6 kg)   Past GI procedures: Dr. Loreta Ave Colonoscopy 07/11/2016 -Preparation extremely poor -6 mm polyp s/p polypectomy -Repeat in 1 year after 2-day prep  Colonoscopy 10/26/2017 -8 mm mid ascending colon polyp s/p hot snare.  Not retrieved as got lost in stool. -Highly redundant colon. -Fair prep. 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  Past Medical History:  Diagnosis Date   Allergy    hayfever    Colon polyp    HLD (hyperlipidemia)    HSV infection     Past Surgical History:  Procedure Laterality Date   CERVICAL FUSION  2004   C5-C6   CESAREAN SECTION  2005/2010   x 2   LUMBAR DISC SURGERY  2018    Family History  Problem Relation Age of Onset   Arthritis Mother    Heart attack Father        12/2010   Pulmonary fibrosis Father    Breast cancer Maternal Grandmother    Alzheimer's disease Maternal Grandmother    COPD Maternal Grandfather    Heart failure Paternal Grandmother    Breast cancer Paternal Grandmother    Diabetes Paternal Grandmother  Social History   Tobacco Use   Smoking status: Never   Smokeless tobacco: Never  Vaping Use   Vaping status: Never Used  Substance Use Topics   Alcohol use: No    Comment: 0-1 per week, social   Drug use: No    Current Outpatient Medications  Medication Sig Dispense Refill   acyclovir (ZOVIRAX) 800 MG tablet Take 800 mg by mouth daily as needed (out breaks).      azelastine (ASTELIN) 0.1 % nasal spray Place 1 spray into  both nostrils as needed.     Cholecalciferol (VITAMIN D-3) 125 MCG (5000 UT) TABS Take 1 tablet by mouth daily.     EPINEPHrine 0.3 mg/0.3 mL IJ SOAJ injection Inject 0.3 mg into the muscle as needed.     fexofenadine (ALLEGRA ALLERGY) 180 MG tablet Take 180 mg by mouth daily.     JUNEL FE 1/20 1-20 MG-MCG tablet PT TO TAKE 1 TABLET DAILY FOR 12 CONSECUTIVE WEEKS OF ACTIVE PILLS 112 tablet 0   naproxen sodium (ANAPROX) 220 MG tablet Take 440 mg by mouth as needed (FOR HEADACHES).     rosuvastatin (CRESTOR) 20 MG tablet Take 20 mg by mouth daily.     No current facility-administered medications for this visit.    Allergies  Allergen Reactions   Seasonal Ic [Cholestatin]     hayfever   Food Other (See Comments)    UNKNOWN REACTION- ALLERGY TESTING CONFIRMED - ALL BEANS   Peanuts [Peanut Oil] Other (See Comments)    During allergy testing was + for allergy.  Has never had an adverse reaction.   Propofol     Due to the soy in the medication   Soy Allergy     Facial sweeling    Review of Systems:  Constitutional: Denies fever, chills, diaphoresis, appetite change and fatigue.  HEENT: Denies photophobia, eye pain, redness, hearing loss, ear pain, congestion, sore throat, rhinorrhea, sneezing, mouth sores, neck pain, neck stiffness and tinnitus.   Respiratory: Denies SOB, DOE, cough, chest tightness,  and wheezing.   Cardiovascular: Denies chest pain, palpitations and leg swelling.  Genitourinary: Denies dysuria, urgency, frequency, hematuria, flank pain and difficulty urinating.  Musculoskeletal: Denies myalgias, back pain, joint swelling, arthralgias and gait problem.  Skin: No rash.  Neurological: Denies dizziness, seizures, syncope, weakness, light-headedness, numbness and occ headaches.  Hematological: Denies adenopathy. Easy bruising, personal or family bleeding history  Psychiatric/Behavioral: No anxiety or depression     Physical Exam:    BP (!) 100/0 (BP Location: Left Arm,  Patient Position: Sitting, Cuff Size: Normal)   Pulse 84   Ht 5\' 7"  (1.702 m) Comment: height measured without shoes  Wt 188 lb 2 oz (85.3 kg)   LMP 11/02/2013   BMI 29.46 kg/m  Wt Readings from Last 3 Encounters:  03/12/23 188 lb 2 oz (85.3 kg)  07/17/16 190 lb (86.2 kg)  02/19/16 180 lb (81.6 kg)   Constitutional:  Well-developed, in no acute distress. Psychiatric: Normal mood and affect. Behavior is normal. HEENT: Pupils normal.  Conjunctivae are normal. No scleral icterus. Neck supple.  Cardiovascular: Normal rate, regular rhythm. No edema Pulmonary/chest: Effort normal and breath sounds normal. No wheezing, rales or rhonchi. Abdominal: Soft, nondistended. Nontender. Bowel sounds active throughout. There are no masses palpable. No hepatomegaly. Rectal: Deferred Neurological: Alert and oriented to person place and time. Skin: Skin is warm and dry. No rashes noted.    Edman Circle, MD 03/12/2023, 9:34 AM  Cc: Melida Quitter,  MD

## 2023-03-17 ENCOUNTER — Telehealth: Payer: Self-pay | Admitting: Gastroenterology

## 2023-03-17 MED ORDER — LINACLOTIDE 72 MCG PO CAPS: 72.0000 ug | ORAL_CAPSULE | Freq: Every day | ORAL | 3 refills | Status: DC

## 2023-03-17 NOTE — Telephone Encounter (Signed)
Medication sent. Can I see about a PA on this Linzess capsule please?

## 2023-03-17 NOTE — Telephone Encounter (Signed)
Inbound call from patient stating that she was given samples of Linzess and was told if it worked she could have a prescription. Patient states mediation is working and would like prescription. Please advise.

## 2023-03-18 ENCOUNTER — Telehealth: Payer: Self-pay

## 2023-03-18 ENCOUNTER — Other Ambulatory Visit (HOSPITAL_COMMUNITY): Payer: Self-pay

## 2023-03-18 NOTE — Telephone Encounter (Signed)
No prior auth needed for Linzess 72 mcg capsule patient filled 03/17/2023 @ pharmacy of choice.

## 2023-03-18 NOTE — Telephone Encounter (Signed)
Pharmacy Patient Advocate Encounter   Received notification from CoverMyMeds that prior authorization for Linzess 72 mcg capsule is required/requested.   Insurance verification completed.   The patient is insured through [facility] .   Per test claim: PA required; PA started via CoverMyMeds. KEY [id number] . Waiting for clinical questions to populate.

## 2023-03-23 NOTE — Telephone Encounter (Signed)
Noted  

## 2023-03-26 ENCOUNTER — Other Ambulatory Visit (HOSPITAL_COMMUNITY): Payer: Self-pay

## 2023-03-26 NOTE — Telephone Encounter (Signed)
Pharmacy Patient Advocate Encounter  Received notification from [facility] that Prior Authorization for Linzess 72 mcg capsules has been  R esolved. No prior auth needed at this time.  Key: [id number]  NEXT AVAILABLE FILL DATE [id number] LAST FILL DT [id number] FILLED AT [facility]ARM[id number],PHONE #[phone]

## 2023-05-29 ENCOUNTER — Encounter: Payer: Self-pay | Admitting: Gastroenterology

## 2023-06-05 ENCOUNTER — Ambulatory Visit (AMBULATORY_SURGERY_CENTER): Payer: 59 | Admitting: Gastroenterology

## 2023-06-05 ENCOUNTER — Encounter: Payer: Self-pay | Admitting: Gastroenterology

## 2023-06-05 VITALS — BP 118/71 | HR 64 | Temp 98.8°F | Resp 13 | Ht 67.0 in | Wt 188.0 lb

## 2023-06-05 DIAGNOSIS — K5909 Other constipation: Secondary | ICD-10-CM

## 2023-06-05 DIAGNOSIS — K640 First degree hemorrhoids: Secondary | ICD-10-CM | POA: Diagnosis not present

## 2023-06-05 DIAGNOSIS — D120 Benign neoplasm of cecum: Secondary | ICD-10-CM

## 2023-06-05 DIAGNOSIS — Z1211 Encounter for screening for malignant neoplasm of colon: Secondary | ICD-10-CM | POA: Diagnosis present

## 2023-06-05 DIAGNOSIS — D122 Benign neoplasm of ascending colon: Secondary | ICD-10-CM | POA: Diagnosis not present

## 2023-06-05 DIAGNOSIS — K635 Polyp of colon: Secondary | ICD-10-CM

## 2023-06-05 DIAGNOSIS — Z860100 Personal history of colon polyps, unspecified: Secondary | ICD-10-CM

## 2023-06-05 DIAGNOSIS — Q438 Other specified congenital malformations of intestine: Secondary | ICD-10-CM | POA: Diagnosis not present

## 2023-06-05 MED ORDER — SODIUM CHLORIDE 0.9 % IV SOLN: 500.0000 mL | INTRAVENOUS | Status: DC

## 2023-06-05 NOTE — Patient Instructions (Signed)

## 2023-06-05 NOTE — Progress Notes (Signed)
Called to room to assist during endoscopic procedure.  Patient ID and intended procedure confirmed with present staff. Received instructions for my participation in the procedure from the performing physician.

## 2023-06-05 NOTE — Op Note (Signed)
[facility] Patient Name: Whitney Logan Procedure Date: 06/05/2023 8:14 AM MRN: [id number] Endoscopist: Lynann Bologna , MD, [id number] Age: 58 Referring MD:  Date of Birth: 26-Apr-1965 Gender: Female Account #: [id number] Procedure:                Colonoscopy Indications:              High risk colon cancer surveillance: Personal                            history of colonic polyps Medicines:                Monitored Anesthesia Care Procedure:                Pre-Anesthesia Assessment:                           - Prior to the procedure, a History and Physical                            was performed, and patient medications and                            allergies were reviewed. The patient's tolerance of                            previous anesthesia was also reviewed. The risks                            and benefits of the procedure and the sedation                            options and risks were discussed with the patient.                            All questions were answered, and informed consent                            was obtained. Prior Anticoagulants: The patient has                            taken no anticoagulant or antiplatelet agents. ASA                            Grade Assessment: II - A patient with mild systemic                            disease. After reviewing the risks and benefits,                            the patient was deemed in satisfactory condition to                            undergo the procedure.  After obtaining informed consent, the colonoscope                            was passed under direct vision. Throughout the                            procedure, the patient's blood pressure, pulse, and                            oxygen saturations were monitored continuously. The                            CF [id number] #[id number] was introduced through the anus                            and advanced to the the cecum,  identified by                            appendiceal orifice and ileocecal valve. The                            colonoscopy was somewhat difficult due to retained                            stool and a tortuous colon. Successful completion                            of the procedure was aided by applying abdominal                            pressure and lavage. Aggressive suctioning and                            aspiration was performed. Overall over 85 to 90% of                            the colonic mucosa was visualized satisfactorily.                            Of note that the small and flat lesions could have                            been missed. The patient tolerated the procedure                            well. The quality of the bowel preparation was                            adequate to identify polyps greater than 5 mm in                            size. The ileocecal valve, appendiceal orifice, and  rectum were photographed.                           This is after 2-day prep. Scope In: 8:19:39 AM Scope Out: 8:44:15 AM Scope Withdrawal Time: 0 hours 14 minutes 27 seconds  Total Procedure Duration: 0 hours 24 minutes 36 seconds  Findings:                 Two sessile polyps were found in the proximal                            ascending colon and cecum. The polyps were 4 to 6                            mm in size. These polyps were removed with a cold                            snare. Resection and retrieval were complete.                           Non-bleeding internal hemorrhoids were found during                            retroflexion. The hemorrhoids were small and Grade                            I (internal hemorrhoids that do not prolapse).                           The exam was otherwise without abnormality on                            direct and retroflexion views. Complications:            No immediate complications. Estimated Blood  Loss:     Estimated blood loss: none. Impression:               - Two 4 to 6 mm polyps in the proximal ascending                            colon and in the cecum, removed with a cold snare.                            Resected and retrieved.                           - Non-bleeding internal hemorrhoids.                           - The examination was otherwise normal on direct                            and retroflexion views. Recommendation:           - Patient has a contact number available for  emergencies. The signs and symptoms of potential                            delayed complications were discussed with the                            patient. Return to normal activities tomorrow.                            Written discharge instructions were provided to the                            patient.                           - Resume previous diet.                           - Continue present medications.                           - Continue MiraLAX 17 g p.o. daily.                           - Minimize nonsteroidals.                           - Increase water intake.                           - Await pathology results.                           - Repeat colonoscopy for surveillance based on                            pathology results.                           - The findings and recommendations were discussed                            with the patient's family. Lynann Bologna, MD 06/05/2023 8:49:45 AM This report has been signed electronically.

## 2023-06-05 NOTE — Progress Notes (Signed)
Report to PACU, RN, vss, BBS= Clear.

## 2023-06-05 NOTE — Progress Notes (Signed)
 Chief Complaint: For colon  Referring Provider:  Lynann Bologna, MD      ASSESSMENT AND PLAN;   #1. H/O polyps  #2. Chronic constipation/IBS-C  Plan: -Colon with 2 day prep (sutabs and miralax) -Linzess 75 mcg po every day (samples given) -Continue Mg/stool softeners -Minimize nonsteroidals. -Increase water intake.  Continue exercising.   Discussed risks & benefits of colonoscopy. Risks including rare perforation req laparotomy, bleeding after bx/polypectomy req blood transfusion, rarely missing neoplasms, risks of anesthesia/sedation, rare risk of damage to internal organs. Benefits outweigh the risks. Patient agrees to proceed. All the questions were answered. Pt consents to proceed.  HPI:    Whitney Logan is a 58 y.o. 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 They report a transit time of every other to every third day, despite daily intake of stool softeners and magnesium, and a diet rich in fiber and whole grains. The patient denies constipation, describing the stool as a 'solid log' rather than hard. They report feeling bloated and uncomfortable, often experiencing pressure up to their rib cage if they haven't had a bowel movement for a couple of days.  The patient has had several colonoscopies in the past, with the most recent one revealing a polyp that was lost before it could be tested. They report difficulty with the prep for the procedure, often resulting in vomiting with Golyte.  The patient also mentions occasional headaches and neck pain, for which they take sodium naproxen as needed. They acknowledge a high caffeine intake, mostly in the morning, and drink flavored water in the afternoon.  The patient's bowel irregularity has been a lifelong issue, only improving during their pregnancies. They express frustration with their irregularity,  comparing it to their husband's and son's regular bowel movements. Their daughter, however, also struggles with irregularity. The patient is open to trying new medications to help regulate their bowel movements.   No nausea, vomiting, heartburn, regurgitation, odynophagia or dysphagia.  No diarrhea.  No melena or hematochezia. No unintentional weight loss. No abdominal pain.     Wt Readings from Last 3 Encounters:  06/05/23 188 lb (85.3 kg)  03/12/23 188 lb 2 oz (85.3 kg)  07/17/16 190 lb (86.2 kg)   Past GI procedures: Dr. Loreta Ave Colonoscopy 07/11/2016 -Preparation extremely poor -6 mm polyp s/p polypectomy -Repeat in 1 year after 2-day prep  Colonoscopy 10/26/2017 -8 mm mid ascending colon polyp s/p hot snare.  Not retrieved as got lost in stool. -Highly redundant colon. -Fair prep. 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  Past Medical History:  Diagnosis Date   Allergy    hayfever    Colon polyp    HLD (hyperlipidemia)    HSV infection     Past Surgical History:  Procedure Laterality Date   CERVICAL FUSION  2004   C5-C6   CESAREAN SECTION  2005/2010   x 2   LUMBAR DISC SURGERY  2018    Family History  Problem Relation Age of Onset   Arthritis Mother    Heart attack Father        12/2010   Pulmonary fibrosis Father    Breast cancer Maternal Grandmother    Alzheimer's disease Maternal Grandmother    COPD Maternal Grandfather    Heart failure Paternal Grandmother    Breast cancer Paternal Grandmother    Diabetes Paternal Grandmother  Colon cancer Neg Hx    Stomach cancer Neg Hx    Esophageal varices Neg Hx    Rectal cancer Neg Hx     Social History   Tobacco Use   Smoking status: Never   Smokeless tobacco: Never  Vaping Use   Vaping status: Never Used  Substance Use Topics   Alcohol use: No    Comment: 0-1 per week, social   Drug use: No    Current Outpatient Medications  Medication Sig Dispense Refill   acyclovir (ZOVIRAX) 800 MG tablet Take 800 mg  by mouth daily as needed (out breaks).      azelastine (ASTELIN) 0.1 % nasal spray Place 1 spray into both nostrils as needed.     linaclotide (LINZESS) 72 MCG capsule Take 1 capsule (72 mcg total) by mouth daily before breakfast. 30 capsule 3   rosuvastatin (CRESTOR) 20 MG tablet Take 20 mg by mouth daily.     Cholecalciferol (VITAMIN D-3) 125 MCG (5000 UT) TABS Take 1 tablet by mouth daily.     EPINEPHrine 0.3 mg/0.3 mL IJ SOAJ injection Inject 0.3 mg into the muscle as needed.     fexofenadine (ALLEGRA ALLERGY) 180 MG tablet Take 180 mg by mouth daily.     naproxen sodium (ANAPROX) 220 MG tablet Take 440 mg by mouth as needed (FOR HEADACHES).     Current Facility-Administered Medications  Medication Dose Route Frequency Provider Last Rate Last Admin   0.9 %  sodium chloride infusion  500 mL Intravenous Continuous Lynann Bologna, MD        Allergies  Allergen Reactions   Peanuts [Peanut Oil] Other (See Comments)    During allergy testing was + for allergy.  Has never had an adverse reaction.   Seasonal Ic [Cholestatin]     hayfever   Soy Allergy (Obsolete) Other (See Comments)    Facial sweeling   Food Other (See Comments)    UNKNOWN REACTION- ALLERGY TESTING CONFIRMED - ALL BEANS   Propofol Other (See Comments)    Due to the soy in the medication from allergy testing     Review of Systems:  Constitutional: Denies fever, chills, diaphoresis, appetite change and fatigue.  HEENT: Denies photophobia, eye pain, redness, hearing loss, ear pain, congestion, sore throat, rhinorrhea, sneezing, mouth sores, neck pain, neck stiffness and tinnitus.   Respiratory: Denies SOB, DOE, cough, chest tightness,  and wheezing.   Cardiovascular: Denies chest pain, palpitations and leg swelling.  Genitourinary: Denies dysuria, urgency, frequency, hematuria, flank pain and difficulty urinating.  Musculoskeletal: Denies myalgias, back pain, joint swelling, arthralgias and gait problem.  Skin: No rash.   Neurological: Denies dizziness, seizures, syncope, weakness, light-headedness, numbness and occ headaches.  Hematological: Denies adenopathy. Easy bruising, personal or family bleeding history  Psychiatric/Behavioral: No anxiety or depression     Physical Exam:    BP (!) 113/58   Pulse 88   Temp 98.8 F (37.1 C)   Ht 5\' 7"  (1.702 m)   Wt 188 lb (85.3 kg)   LMP 11/02/2013   SpO2 97%   BMI 29.44 kg/m  Wt Readings from Last 3 Encounters:  06/05/23 188 lb (85.3 kg)  03/12/23 188 lb 2 oz (85.3 kg)  07/17/16 190 lb (86.2 kg)   Constitutional:  Well-developed, in no acute distress. Psychiatric: Normal mood and affect. Behavior is normal. HEENT: Pupils normal.  Conjunctivae are normal. No scleral icterus. Neck supple.  Cardiovascular: Normal rate, regular rhythm. No edema Pulmonary/chest: Effort normal and breath  sounds normal. No wheezing, rales or rhonchi. Abdominal: Soft, nondistended. Nontender. Bowel sounds active throughout. There are no masses palpable. No hepatomegaly. Rectal: Deferred Neurological: Alert and oriented to person place and time. Skin: Skin is warm and dry. No rashes noted.    Edman Circle, MD 06/05/2023, 8:14 AM  Cc: Lynann Bologna, MD

## 2023-06-05 NOTE — Progress Notes (Signed)
Pt's states no medical or surgical changes since previsit or office visit.

## 2023-06-08 ENCOUNTER — Telehealth: Payer: Self-pay | Admitting: *Deleted

## 2023-06-08 NOTE — Telephone Encounter (Signed)
 No answer on  follow up call. Left message.

## 2023-06-09 ENCOUNTER — Other Ambulatory Visit: Payer: Self-pay | Admitting: Obstetrics and Gynecology

## 2023-06-09 DIAGNOSIS — Z1231 Encounter for screening mammogram for malignant neoplasm of breast: Secondary | ICD-10-CM

## 2023-06-09 LAB — SURGICAL PATHOLOGY

## 2023-06-11 ENCOUNTER — Encounter: Payer: Self-pay | Admitting: Gastroenterology

## 2023-07-24 ENCOUNTER — Telehealth: Payer: Self-pay

## 2023-07-24 ENCOUNTER — Other Ambulatory Visit (HOSPITAL_COMMUNITY): Payer: Self-pay

## 2023-07-24 ENCOUNTER — Ambulatory Visit (HOSPITAL_BASED_OUTPATIENT_CLINIC_OR_DEPARTMENT_OTHER): Payer: BC Managed Care – PPO | Admitting: Internal Medicine

## 2023-07-24 VITALS — BP 114/68 | HR 68 | Ht 67.0 in | Wt 188.1 lb

## 2023-07-24 DIAGNOSIS — E7801 Familial hypercholesterolemia: Secondary | ICD-10-CM | POA: Diagnosis not present

## 2023-07-24 DIAGNOSIS — E7841 Elevated Lipoprotein(a): Secondary | ICD-10-CM | POA: Diagnosis not present

## 2023-07-24 NOTE — Telephone Encounter (Signed)
 Pharmacy Patient Advocate Encounter  Received notification from [facility] that Prior Authorization for REPATHA has been APPROVED from 07/24/23 to 07/23/24. Ran test claim, Copay is $30. This test claim was processed through [facility]- copay amounts may vary at other pharmacies due to pharmacy/plan contracts, or as the patient moves through the different stages of their insurance plan.

## 2023-07-24 NOTE — Progress Notes (Signed)
 LIPID CLINIC CONSULT NOTE  Chief Complaint:  Manage dyslipidemia  Primary Care Physician: Whitney Quitter, MD  Primary Cardiologist:  None  HPI:  Whitney Logan is a 58 y.o. female who is being seen today for the evaluation of dyslipidemia at the request of Wile, Nyoka Cowden, MD. 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  She is originally from [place].  She has a family history of early onset heart disease in her father who had an MI in his 65s but did survive.  In testing she has had high cholesterol in the past with an LDL over 200, particularly 210 but went on to statin therapy which is 20 mg rosuvastatin.  Recent labs show total cholesterol 206, triglycerides 144, HDL 62 and LDL 115.  She also had assessment of LP(a) which is also elevated at 211.7 nmol/L.  She has no known cardiovascular disease.  Overall she eats very healthy.  She says she exercises regularly and has for a number of years.  PMHx:  Past Medical History:  Diagnosis Date   Allergy    hayfever    Colon polyp    HLD (hyperlipidemia)    HSV infection     Past Surgical History:  Procedure Laterality Date   CERVICAL FUSION  2004   C5-C6   CESAREAN SECTION  2005/2010   x 2   LUMBAR DISC SURGERY  2018    FAMHx:  Family History  Problem Relation Age of Onset   Arthritis Mother    Heart attack Father        12/2010   Pulmonary fibrosis Father    Breast cancer Maternal Grandmother    Alzheimer's disease Maternal Grandmother    COPD Maternal Grandfather    Heart failure Paternal Grandmother    Breast cancer Paternal Grandmother    Diabetes Paternal Grandmother    Colon cancer Neg Hx    Stomach cancer Neg Hx    Esophageal varices Neg Hx    Rectal cancer Neg Hx     SOCHx:   reports that she has never smoked. She has never used smokeless tobacco. She reports that she does not drink alcohol and does not use drugs.  ALLERGIES:  Allergies  Allergen Reactions   Peanuts [Peanut Oil] Other  (See Comments)    During allergy testing was + for allergy.  Has never had an adverse reaction.   Seasonal Ic [Cholestatin]     hayfever   Soy Allergy (Obsolete) Other (See Comments)    Facial sweeling   Food Other (See Comments)    UNKNOWN REACTION- ALLERGY TESTING CONFIRMED - ALL BEANS   Propofol Other (See Comments)    Due to the soy in the medication from allergy testing     ROS: Pertinent items noted in HPI and remainder of comprehensive ROS otherwise negative.  HOME MEDS: Current Outpatient Medications on File Prior to Visit  Medication Sig Dispense Refill   acyclovir (ZOVIRAX) 800 MG tablet Take 800 mg by mouth daily as needed (out breaks).      azelastine (ASTELIN) 0.1 % nasal spray Place 1 spray into both nostrils as needed.     Cholecalciferol (VITAMIN D-3) 125 MCG (5000 UT) TABS Take 1 tablet by mouth daily.     EPINEPHrine 0.3 mg/0.3 mL IJ SOAJ injection Inject 0.3 mg into the muscle as needed.     fexofenadine (ALLEGRA ALLERGY) 180 MG tablet Take 180 mg by mouth daily.     linaclotide (  LINZESS) 72 MCG capsule Take 1 capsule (72 mcg total) by mouth daily before breakfast. 30 capsule 3   naproxen sodium (ANAPROX) 220 MG tablet Take 440 mg by mouth as needed (FOR HEADACHES).     rosuvastatin (CRESTOR) 20 MG tablet Take 20 mg by mouth daily.     No current facility-administered medications on file prior to visit.    LABS/IMAGING: No results found for this or any previous visit (from the past 48 hours). No results found.  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   Ms. Whitney Logan has probable familial hyperlipidemia with an LDL greater than 190.  She has had a substantial reduction in her cholesterol with high intensity rosuvastatin however her LDL remains above target less than 70 per guidelines on statin therapy.  I would recommend additional therapy with Repatha.  This can also potentially benefit her if she gets some reduction in LP(a) as well.  Hopefully this will get [place] to target.  We discussed the medicine at length today including administration, side effects and risk and benefit and she is agreeable to proceed.  Will reach out for prior authorization.  Plan repeat lipids including NMR and LP(a) in about 4 months and she can follow-up with our APP at that time.  Thanks again for the kind referral.  Whitney Nose, MD, [facility]  [place]  [facility]  Medical Director of the Advanced Lipid Disorders &  Cardiovascular Risk Reduction Clinic Diplomate of the American Board of Clinical Lipidology Attending Cardiologist  Direct Dial: [phone]  Fax: [phone]  Website:  [email or web address] 07/24/2023, 12:59 PM

## 2023-07-24 NOTE — Telephone Encounter (Signed)
-----   Message from Nurse Blima Singer sent at 07/24/2023 10:32 AM EDT ----- Regarding: PA for repatha Hello,  Pt needs prior auth for repatha, see labs under media. She has elevated LP(a) and Familial hypercholesterolemia.  Thank you! Carma Lair

## 2023-07-24 NOTE — Telephone Encounter (Signed)
 Left message for pt to call back regarding Repatha approval and where she would like prescription send to.

## 2023-07-24 NOTE — Telephone Encounter (Signed)
 Pharmacy Patient Advocate Encounter   Received notification from Physician's Office that prior authorization for REPATHA is required/requested.   Insurance verification completed.   The patient is insured through [employer] .   Per test claim: PA required; PA submitted to above mentioned insurance via CoverMyMeds Key/confirmation #/EOC [id number] Status is pending

## 2023-07-24 NOTE — Patient Instructions (Addendum)
 Medication Instructions:   Dr. Rennis Golden recommends Repatha (PCSK9). This is an injectable cholesterol medication self-administered once every 14 days. This medication will likely need prior approval with your insurance company, which we will work on. If the medication is not approved initially, we may need to do an appeal with your insurance.   Administer medication in area of fatty tissue such as abdomen, outer thigh, back of upper arm - and rotate site with each injection Store medication in refrigerator until ready to administer - allow to sit at room temp for 30 mins - 1 hour prior to injection Dispose of medication in a SHARPS container - your pharmacy should be able to direct you on this and proper disposal   If you need a co-pay card for Repatha: Lawsponsor.fr If you need a co-pay card for Praluent: [email or web address]  Patient Assistance:    These foundations have funds at various times.   The PAN Foundation: https://www.panfoundation.org/disease-funds/hypercholesterolemia/ -- can sign up for wait list  The [facility] offers assistance to help pay for medication copays.  They will cover copays for all cholesterol lowering meds, including statins, fibrates, omega-3 fish oils like Vascepa, ezetimibe, Repatha, Praluent, Nexletol, Nexlizet.  The cards are usually good for $2,500 or 12 months, whichever comes first. Our fax # is [phone] (you will need this to apply) Go to healthwellfoundation.org Click on "Apply Now" Answer questions as to whom is applying (patient or representative) Your disease fund will be "hypercholesterolemia - Medicare access" They will ask questions about finances and which medications you are taking for cholesterol When you submit, the approval is usually within minutes.  You will need to print the card information from the site You will need to show this information to your pharmacy, they will bill your  Medicare Part D plan first -then bill Health Well --for the copay.   You can also call them at [phone], although the hold times can be quite long.      *If you need a refill on your cardiac medications before your next appointment, please call your pharmacy*  Lab Work: Your physician recommends that you return for lab work in: 3-4 months FASTING NMR & LP(a)  If you have labs (blood work) drawn today and your tests are completely normal, you will receive your results only by: MyChart Message (if you have MyChart) OR A paper copy in the mail If you have any lab test that is abnormal or we need to change your treatment, we will call you to review the results.   Follow-Up: At [facility], you and your health needs are our priority.  As part of our continuing mission to provide you with exceptional heart care, our providers are all part of one team.  This team includes your primary Cardiologist (physician) and Advanced Practice Providers or APPs (Physician Assistants and Nurse Practitioners) who all work together to provide you with the care you need, when you need it.  Your next appointment:   3-4 month(s)  Provider:   Eligha Bridegroom, NP    We recommend signing up for the patient portal called "MyChart".  Sign up information is provided on this After Visit Summary.  MyChart is used to connect with patients for Virtual Visits (Telemedicine).  Patients are able to view lab/test results, encounter notes, upcoming appointments, etc.  Non-urgent messages can be sent to your provider as well.   To learn more about what you can do with MyChart, go to [email or web address].

## 2023-07-24 NOTE — Addendum Note (Signed)
 Addended by: Bernita Buffy on: 07/24/2023 04:52 PM   Modules accepted: Orders

## 2023-07-27 NOTE — Telephone Encounter (Signed)
 MyChart message sent to patient about med approval. Requested update on preferred pharmacy to send in Rx

## 2023-08-03 ENCOUNTER — Other Ambulatory Visit (HOSPITAL_BASED_OUTPATIENT_CLINIC_OR_DEPARTMENT_OTHER): Payer: Self-pay

## 2023-08-03 MED ORDER — REPATHA SURECLICK 140 MG/ML ~~LOC~~ SOAJ
140.0000 mg | SUBCUTANEOUS | 3 refills | Status: AC
  Filled 2023-08-03: qty 6, 84d supply, fill #0
  Filled 2023-10-24: qty 6, 84d supply, fill #1
  Filled 2024-01-14: qty 6, 84d supply, fill #2
  Filled 2024-04-10: qty 6, 84d supply, fill #3

## 2023-08-03 NOTE — Addendum Note (Signed)
 Addended by: Francesco Inks on: 08/03/2023 08:31 AM   Modules accepted: Orders

## 2023-08-04 NOTE — Telephone Encounter (Signed)
 Rx(s) sent to pharmacy electronically.

## 2023-09-28 ENCOUNTER — Other Ambulatory Visit: Payer: Self-pay | Admitting: Gastroenterology

## 2023-10-24 ENCOUNTER — Other Ambulatory Visit (HOSPITAL_BASED_OUTPATIENT_CLINIC_OR_DEPARTMENT_OTHER): Payer: Self-pay

## 2023-11-11 ENCOUNTER — Ambulatory Visit
Admission: RE | Admit: 2023-11-11 | Discharge: 2023-11-11 | Disposition: A | Discharge status: Home / Self Care | Source: Ambulatory Visit | Attending: Obstetrics and Gynecology | Admitting: Obstetrics and Gynecology

## 2023-11-11 DIAGNOSIS — Z1231 Encounter for screening mammogram for malignant neoplasm of breast: Secondary | ICD-10-CM

## 2023-11-11 NOTE — Progress Notes (Signed)
 Cardiology Office Note   Date:  11/18/2023  ID:  Whitney Logan, Whitney Logan [birth year], MRN [id number] PCP: Stephane Leita DEL, MD  [facility] Providers Cardiologist:  None     PMH Dyslipidemia Probable familial hyperlipidemia with LDL > 190 Family history early onset heart disease Elevated lipoprotein a =211.7 nmol/L  Referred to advanced lipid disorder clinic and seen by Dr. Mona on 07/24/2023.  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  Reports early onset heart disease in her father who had an MI in his 76s, he did survive.  History of LDL 210 for which she started rosuvastatin 20 mg daily.  Recent labs showed total cholesterol 206, triglycerides 144, HDL 62, and LDL 115.  LP(a) elevated at 211.7 nmol/L.  No known history of cardiovascular disease.  She reports very healthy diet and regular exercise, which she has done for a number of years.  Recommendation to start Repatha  140 mg every 14 days and have repeat lipid testing in 4 months.   History of Present Illness  Discussed the use of AI scribe software for clinical note transcription with the patient, who gave verbal consent to proceed.  History of Present Illness Whitney Logan is a very pleasant 58 year old female who presents for a follow-up on her lipid disorder. She has a significant family history of elevated lipoprotein A and ASCVD. She is tolerating Repatha  without any concerning side effects. She recently increased rosuvastatin from 10 mg to 20 mg and is tolerating without side effects. Her blood glucose levels fluctuate between 98 and 101. She recently started Mounjaro to manage her blood glucose and has completed her second injection. She experienced gastrointestinal distress initially. Previously used Ozempic, resulting in a 45-pound weight loss over six months. She maintains an active lifestyle, exercising at least four days a week, including biking, walking, and weightlifting. She is cautious about her diet due to  multiple food allergies and avoids processed foods. She feels less tolerant of heat this year and perceives a decrease in fitness, attributing these changes to either her medications or aging. She denies chest pain, shortness of breath, lightheadedness, palpitations, presyncope, syncope, orthopnea, PND or edema.   ROS: See HPI  Studies Reviewed EKG Interpretation Date/Time:  Wednesday November 18 2023 09:22:11 EDT Ventricular Rate:  67 PR Interval:  176 QRS Duration:  80 QT Interval:  412 QTC Calculation: 435 R Axis:   86  Text Interpretation: Normal sinus rhythm Normal ECG When compared with ECG of 11-Feb-2015 05:58, PREVIOUS ECG IS PRESENT Confirmed by Percy Browning [id number]) on 11/18/2023 9:49:27 AM    No results found for: LIPOA  Risk Assessment/Calculations           Physical Exam VS:  BP 108/64 (BP Location: Left Arm)   Pulse 67   Ht 5' 8 (1.727 m)   Wt 188 lb (85.3 kg)   LMP 11/02/2013   SpO2 95%   BMI 28.59 kg/m    Wt Readings from Last 3 Encounters:  11/18/23 188 lb (85.3 kg)  07/24/23 188 lb 1.6 oz (85.3 kg)  06/05/23 188 lb (85.3 kg)    GEN: Well nourished, well developed in no acute distress NECK: No JVD; No carotid bruits CARDIAC: RRR, no murmurs, rubs, gallops RESPIRATORY:  Clear to auscultation without rales, wheezing or rhonchi  ABDOMEN: Soft, non-tender, non-distended EXTREMITIES:  No edema; No deformity   Assessment & Plan Familial hypercholesterolemia  Elevated lipoprotein(a)   Referred for elevated LP(a) and probable  familial hyperlipidemia with LDL greater than 190. She is tolerating Repatha  and rosuvastatin without concerning side effects.  She has mild injection site discomfort with Repatha . She leads a healthy lifestyle with regular exercise and healthy diet.  -Repeat fasting lipid profile for surveillance -Consider CT calcium score for further risk stratification  -Consider lower dose of rosuvastatin if LDL consistently low -Continue Repatha   and rosuvastatin  Prediabetes   A1C 6.2% on labs completed 08/12/23. She started on Mounjaro due to gradual increase in blood sugar.  She had an episode of diarrhea with her first injection, but also took Linzess .  She will not take Linzess  with next injection of Mounjaro in hopes this alleviates diarrhea.  She had previous success of 45 pound weight loss on Ozempic. She plans to take lowest dose possible to achieve A1C improvement -Continue Mounjaro with maintenance provided by PCP -Continue heart healthy mostly plant based diet avoiding saturated fat, processed foods, simple carbohydrates,  -And continue to aim for at least 150 minutes of moderate intensity exercise each week along with regular physical activity  Cardiac Risk Assessment She leads an active lifestyle and is not having any symptoms concerning for angina.  No indication for further ischemia evaluation at this time.  Encouraged her to consider CT calcium score for further risk stratification.          Dispo: 1 year with Lipid clinic  Signed, Rosaline Bane, NP-C

## 2023-11-17 ENCOUNTER — Other Ambulatory Visit: Payer: Self-pay | Admitting: Obstetrics and Gynecology

## 2023-11-17 DIAGNOSIS — R928 Other abnormal and inconclusive findings on diagnostic imaging of breast: Secondary | ICD-10-CM

## 2023-11-18 ENCOUNTER — Ambulatory Visit (HOSPITAL_BASED_OUTPATIENT_CLINIC_OR_DEPARTMENT_OTHER): Admitting: Nurse Practitioner

## 2023-11-18 ENCOUNTER — Encounter (HOSPITAL_BASED_OUTPATIENT_CLINIC_OR_DEPARTMENT_OTHER): Payer: Self-pay | Admitting: Nurse Practitioner

## 2023-11-18 VITALS — BP 108/64 | HR 67 | Ht 68.0 in | Wt 188.0 lb

## 2023-11-18 DIAGNOSIS — E7841 Elevated Lipoprotein(a): Secondary | ICD-10-CM

## 2023-11-18 DIAGNOSIS — R7303 Prediabetes: Secondary | ICD-10-CM

## 2023-11-18 DIAGNOSIS — E7801 Familial hypercholesterolemia: Secondary | ICD-10-CM | POA: Diagnosis not present

## 2023-11-18 DIAGNOSIS — Z7189 Other specified counseling: Secondary | ICD-10-CM

## 2023-11-18 MED ORDER — LINACLOTIDE 72 MCG PO CAPS: 72.0000 ug | ORAL_CAPSULE | ORAL | Status: AC

## 2023-11-18 NOTE — Patient Instructions (Signed)
 Medication Instructions:   Your physician recommends that you continue on your current medications as directed. Please refer to the Current Medication list given to you today.    *If you need a refill on your cardiac medications before your next appointment, please call your pharmacy*  Lab Work:  TODAY!!!! NMR/LPa  If you have labs (blood work) drawn today and your tests are completely normal, you will receive your results only by: MyChart Message (if you have MyChart) OR A paper copy in the mail If you have any lab test that is abnormal or we need to change your treatment, we will call you to review the results.  Testing/Procedures:  None ordered.   Follow-Up: At [facility], you and your health needs are our priority.  As part of our continuing mission to provide you with exceptional heart care, our providers are all part of one team.  This team includes your primary Cardiologist (physician) and Advanced Practice Providers or APPs (Physician Assistants and Nurse Practitioners) who all work together to provide you with the care you need, when you need it.  Your next appointment:   1 year(s)  Provider:   Rosaline Bane, NP    We recommend signing up for the patient portal called MyChart.  Sign up information is provided on this After Visit Summary.  MyChart is used to connect with patients for Virtual Visits (Telemedicine).  Patients are able to view lab/test results, encounter notes, upcoming appointments, etc.  Non-urgent messages can be sent to your provider as well.   To learn more about what you can do with MyChart, go to [email or web address].   Other Instructions  Your physician wants you to follow-up in: 1 year.  You will receive a reminder letter in the mail two months in advance. If you don't receive a letter, please call our office to schedule the follow-up appointment.

## 2023-11-20 ENCOUNTER — Ambulatory Visit: Payer: Self-pay | Admitting: Internal Medicine

## 2023-11-20 LAB — LIPOPROTEIN A (LPA): Lipoprotein (a): 201 nmol/L — ABNORMAL HIGH (ref <75.0)

## 2023-11-20 LAB — NMR, LIPOPROFILE
Cholesterol, Total: 106 mg/dL (ref 100–199)
HDL Particle Number: 43.9 umol/L (ref >=30.5)
HDL-C: 62 mg/dL (ref >39)
LDL Particle Number: 343 nmol/L (ref <1000)
LDL Size: 19.8 nm — ABNORMAL LOW (ref >20.5)
LDL-C (NIH Calc): 25 mg/dL (ref 0–99)
LP-IR Score: 39 (ref <=45)
Small LDL Particle Number: 163 nmol/L (ref <=527)
Triglycerides: 103 mg/dL (ref 0–149)

## 2023-11-23 ENCOUNTER — Ambulatory Visit
Admission: RE | Admit: 2023-11-23 | Discharge: 2023-11-23 | Disposition: A | Discharge status: Home / Self Care | Source: Ambulatory Visit | Attending: Obstetrics and Gynecology | Admitting: Obstetrics and Gynecology

## 2023-11-23 DIAGNOSIS — R928 Other abnormal and inconclusive findings on diagnostic imaging of breast: Secondary | ICD-10-CM

## 2023-11-25 ENCOUNTER — Encounter (HOSPITAL_BASED_OUTPATIENT_CLINIC_OR_DEPARTMENT_OTHER): Payer: Self-pay | Admitting: Internal Medicine

## 2024-01-14 ENCOUNTER — Other Ambulatory Visit (HOSPITAL_BASED_OUTPATIENT_CLINIC_OR_DEPARTMENT_OTHER): Payer: Self-pay

## 2024-04-11 ENCOUNTER — Other Ambulatory Visit (HOSPITAL_BASED_OUTPATIENT_CLINIC_OR_DEPARTMENT_OTHER): Payer: Self-pay
